# Patient Record
Sex: Male | Born: 1960
Health system: Southern US, Community
[De-identification: ages and names within clinical notes are randomized; demographics above are authoritative.]

---

## 2011-11-03 ENCOUNTER — Encounter: Payer: Self-pay | Admitting: Nurse Practitioner

## 2011-11-03 ENCOUNTER — Encounter: Payer: Self-pay | Admitting: Cardiothoracic Surgery

## 2011-11-06 ENCOUNTER — Encounter: Payer: Self-pay | Admitting: Nurse Practitioner

## 2011-11-06 ENCOUNTER — Encounter: Payer: Self-pay | Admitting: Cardiothoracic Surgery

## 2012-01-06 ENCOUNTER — Ambulatory Visit: Payer: Self-pay | Admitting: Internal Medicine

## 2012-01-10 ENCOUNTER — Inpatient Hospital Stay: Payer: Self-pay | Admitting: Family Medicine

## 2012-01-10 LAB — URINALYSIS, COMPLETE
Bilirubin,UR: NEGATIVE
Glucose,UR: NEGATIVE mg/dL (ref 0–75)
Nitrite: NEGATIVE
Ph: 5 (ref 4.5–8.0)
Protein: 30
Transitional Epi: 1

## 2012-01-10 LAB — COMPREHENSIVE METABOLIC PANEL
Albumin: 1.3 g/dL — ABNORMAL LOW (ref 3.4–5.0)
Anion Gap: 21 — ABNORMAL HIGH (ref 7–16)
BUN: 148 mg/dL — ABNORMAL HIGH (ref 7–18)
Bilirubin,Total: 0.7 mg/dL (ref 0.2–1.0)
Bilirubin,Total: 0.7 mg/dL (ref 0.2–1.0)
Calcium, Total: 7.7 mg/dL — ABNORMAL LOW (ref 8.5–10.1)
Chloride: 103 mmol/L (ref 98–107)
Co2: 9 mmol/L — CL (ref 21–32)
Creatinine: 11.63 mg/dL — ABNORMAL HIGH (ref 0.60–1.30)
Creatinine: 10.67 mg/dL — ABNORMAL HIGH (ref 0.60–1.30)
EGFR (African American): 5 — ABNORMAL LOW
EGFR (African American): 6 — ABNORMAL LOW
EGFR (Non-African Amer.): 4 — ABNORMAL LOW
Glucose: 104 mg/dL — ABNORMAL HIGH (ref 65–99)
Glucose: 124 mg/dL — ABNORMAL HIGH (ref 65–99)
Osmolality: 320 (ref 275–301)
Osmolality: 325 (ref 275–301)
Potassium: 6.4 mmol/L — ABNORMAL HIGH (ref 3.5–5.1)
Potassium: 5.3 mmol/L — ABNORMAL HIGH (ref 3.5–5.1)
SGOT(AST): 12 U/L — ABNORMAL LOW (ref 15–37)
SGPT (ALT): 7 U/L — ABNORMAL LOW (ref 12–78)
SGPT (ALT): 8 U/L — ABNORMAL LOW (ref 12–78)
Total Protein: 7.8 g/dL (ref 6.4–8.2)
Total Protein: 8.2 g/dL (ref 6.4–8.2)

## 2012-01-10 LAB — CK: CK, Total: 101 U/L (ref 35–232)

## 2012-01-10 LAB — CBC
HGB: 7 g/dL — ABNORMAL LOW (ref 13.0–18.0)
MCHC: 30.8 g/dL — ABNORMAL LOW (ref 32.0–36.0)

## 2012-01-10 LAB — CBC WITH DIFFERENTIAL/PLATELET
Basophil %: 0.5 %
Eosinophil #: 0 10*3/uL (ref 0.0–0.7)
Eosinophil %: 0 %
Lymphocyte #: 0.7 10*3/uL — ABNORMAL LOW (ref 1.0–3.6)
MCV: 94 fL (ref 80–100)
Monocyte #: 1.3 x10 3/mm — ABNORMAL HIGH (ref 0.2–1.0)
Monocyte %: 5.4 %
WBC: 24.6 10*3/uL — ABNORMAL HIGH (ref 3.8–10.6)

## 2012-01-10 LAB — SODIUM, URINE, RANDOM: Sodium, Urine Random: 89 mmol/L (ref 20–110)

## 2012-01-10 LAB — CHLORIDE, URINE, RANDOM: Chloride, Urine Random: 80 mmol/L (ref 55–125)

## 2012-01-10 LAB — CREATININE, URINE, RANDOM: Creatinine, Urine Random: 37.5 mg/dL (ref 30.0–125.0)

## 2012-01-11 LAB — CBC WITH DIFFERENTIAL/PLATELET
Basophil #: 0 10*3/uL (ref 0.0–0.1)
Eosinophil #: 0 10*3/uL (ref 0.0–0.7)
HGB: 7.4 g/dL — ABNORMAL LOW (ref 13.0–18.0)
Lymphocyte %: 1.2 %
MCH: 31 pg (ref 26.0–34.0)
MCHC: 33.5 g/dL (ref 32.0–36.0)
MCV: 93 fL (ref 80–100)
Monocyte #: 0.5 x10 3/mm (ref 0.2–1.0)
Monocyte %: 2.5 %
Neutrophil #: 20 10*3/uL — ABNORMAL HIGH (ref 1.4–6.5)
Neutrophil %: 96.3 %
Platelet: 502 10*3/uL — ABNORMAL HIGH (ref 150–440)
RDW: 16.9 % — ABNORMAL HIGH (ref 11.5–14.5)
WBC: 20.8 10*3/uL — ABNORMAL HIGH (ref 3.8–10.6)

## 2012-01-11 LAB — BASIC METABOLIC PANEL
Chloride: 106 mmol/L (ref 98–107)
Co2: 18 mmol/L — ABNORMAL LOW (ref 21–32)
Creatinine: 6.08 mg/dL — ABNORMAL HIGH (ref 0.60–1.30)
EGFR (African American): 11 — ABNORMAL LOW
EGFR (Non-African Amer.): 10 — ABNORMAL LOW

## 2012-01-12 LAB — COMPREHENSIVE METABOLIC PANEL
Albumin: 1.4 g/dL — ABNORMAL LOW (ref 3.4–5.0)
BUN: 73 mg/dL — ABNORMAL HIGH (ref 7–18)
Bilirubin,Total: 0.5 mg/dL (ref 0.2–1.0)
Chloride: 113 mmol/L — ABNORMAL HIGH (ref 98–107)
Glucose: 192 mg/dL — ABNORMAL HIGH (ref 65–99)
Osmolality: 314 (ref 275–301)
SGOT(AST): 10 U/L — ABNORMAL LOW (ref 15–37)
SGPT (ALT): 6 U/L — ABNORMAL LOW (ref 12–78)
Sodium: 144 mmol/L (ref 136–145)
Total Protein: 7.4 g/dL (ref 6.4–8.2)

## 2012-01-12 LAB — URINE CULTURE

## 2012-01-12 LAB — CBC WITH DIFFERENTIAL/PLATELET
Basophil #: 0 10*3/uL (ref 0.0–0.1)
Eosinophil %: 0 %
HCT: 20.3 % — ABNORMAL LOW (ref 40.0–52.0)
HGB: 6.4 g/dL — ABNORMAL LOW (ref 13.0–18.0)
Lymphocyte #: 0.7 10*3/uL — ABNORMAL LOW (ref 1.0–3.6)
MCH: 29.6 pg (ref 26.0–34.0)
MCV: 94 fL (ref 80–100)
Monocyte #: 0.7 x10 3/mm (ref 0.2–1.0)
Monocyte %: 4.2 %
Neutrophil #: 14.8 10*3/uL — ABNORMAL HIGH (ref 1.4–6.5)
Platelet: 404 10*3/uL (ref 150–440)
RBC: 2.16 10*6/uL — ABNORMAL LOW (ref 4.40–5.90)
WBC: 16.1 10*3/uL — ABNORMAL HIGH (ref 3.8–10.6)

## 2012-01-13 DIAGNOSIS — I38 Endocarditis, valve unspecified: Secondary | ICD-10-CM

## 2012-01-13 LAB — CBC WITH DIFFERENTIAL/PLATELET
Basophil #: 0 10*3/uL (ref 0.0–0.1)
Eosinophil #: 0 10*3/uL (ref 0.0–0.7)
Eosinophil %: 0 %
HCT: 19.8 % — ABNORMAL LOW (ref 40.0–52.0)
HGB: 6.5 g/dL — ABNORMAL LOW (ref 13.0–18.0)
Lymphocyte #: 0.4 10*3/uL — ABNORMAL LOW (ref 1.0–3.6)
MCH: 31.1 pg (ref 26.0–34.0)
MCHC: 32.9 g/dL (ref 32.0–36.0)
Monocyte %: 3.8 %
Neutrophil %: 91.9 %
Platelet: 312 10*3/uL (ref 150–440)
RBC: 2.09 10*6/uL — ABNORMAL LOW (ref 4.40–5.90)
WBC: 9.6 10*3/uL (ref 3.8–10.6)

## 2012-01-13 LAB — COMPREHENSIVE METABOLIC PANEL
Alkaline Phosphatase: 95 U/L (ref 50–136)
Anion Gap: 12 (ref 7–16)
Calcium, Total: 7.3 mg/dL — ABNORMAL LOW (ref 8.5–10.1)
Co2: 17 mmol/L — ABNORMAL LOW (ref 21–32)
EGFR (African American): 26 — ABNORMAL LOW
EGFR (Non-African Amer.): 22 — ABNORMAL LOW
Glucose: 153 mg/dL — ABNORMAL HIGH (ref 65–99)
SGOT(AST): 8 U/L — ABNORMAL LOW (ref 15–37)
SGPT (ALT): <6 U/L — ABNORMAL LOW (ref 12–78)
Sodium: 147 mmol/L — ABNORMAL HIGH (ref 136–145)
Total Protein: 6.6 g/dL (ref 6.4–8.2)

## 2012-01-13 LAB — AMMONIA: Ammonia, Plasma: <25 mcmol/L (ref 11–32)

## 2012-01-13 LAB — OCCULT BLOOD X 1 CARD TO LAB, STOOL: Occult Blood, Feces: NEGATIVE

## 2012-01-13 LAB — HEMOGLOBIN: HGB: 7.8 g/dL — ABNORMAL LOW (ref 13.0–18.0)

## 2012-01-14 LAB — BASIC METABOLIC PANEL
Anion Gap: 10 (ref 7–16)
BUN: 55 mg/dL — ABNORMAL HIGH (ref 7–18)
Calcium, Total: 7.1 mg/dL — ABNORMAL LOW (ref 8.5–10.1)
Chloride: 115 mmol/L — ABNORMAL HIGH (ref 98–107)
EGFR (African American): 30 — ABNORMAL LOW
EGFR (Non-African Amer.): 26 — ABNORMAL LOW
Potassium: 3.6 mmol/L (ref 3.5–5.1)
Sodium: 143 mmol/L (ref 136–145)

## 2012-01-14 LAB — CBC WITH DIFFERENTIAL/PLATELET
Basophil %: 0.2 %
Eosinophil #: 0 10*3/uL (ref 0.0–0.7)
Eosinophil %: 0 %
Lymphocyte %: 10.8 %
MCH: 28.2 pg (ref 26.0–34.0)
Monocyte #: 0.4 x10 3/mm (ref 0.2–1.0)
Platelet: 289 10*3/uL (ref 150–440)
RBC: 2.29 10*6/uL — ABNORMAL LOW (ref 4.40–5.90)
RDW: 16.8 % — ABNORMAL HIGH (ref 11.5–14.5)

## 2012-01-15 LAB — PATHOLOGY REPORT

## 2012-01-15 LAB — BASIC METABOLIC PANEL
Anion Gap: 11 (ref 7–16)
BUN: 42 mg/dL — ABNORMAL HIGH (ref 7–18)
Chloride: 113 mmol/L — ABNORMAL HIGH (ref 98–107)
EGFR (Non-African Amer.): 28 — ABNORMAL LOW
Glucose: 173 mg/dL — ABNORMAL HIGH (ref 65–99)
Osmolality: 298 (ref 275–301)
Potassium: 3.8 mmol/L (ref 3.5–5.1)

## 2012-01-15 LAB — HEMOGLOBIN: HGB: 8.7 g/dL — ABNORMAL LOW (ref 13.0–18.0)

## 2012-01-15 LAB — WOUND CULTURE

## 2012-01-16 LAB — CBC WITH DIFFERENTIAL/PLATELET
Basophil #: 0 10*3/uL (ref 0.0–0.1)
Basophil %: 0.1 %
Eosinophil #: 0.1 10*3/uL (ref 0.0–0.7)
Eosinophil %: 0.6 %
Lymphocyte %: 13.9 %
MCHC: 33.1 g/dL (ref 32.0–36.0)
MCV: 91 fL (ref 80–100)
Monocyte %: 9.6 %
Neutrophil #: 7.8 10*3/uL — ABNORMAL HIGH (ref 1.4–6.5)
Platelet: 291 10*3/uL (ref 150–440)
WBC: 10.3 10*3/uL (ref 3.8–10.6)

## 2012-01-16 LAB — BASIC METABOLIC PANEL
Anion Gap: 9 (ref 7–16)
BUN: 37 mg/dL — ABNORMAL HIGH (ref 7–18)
Calcium, Total: 7.1 mg/dL — ABNORMAL LOW (ref 8.5–10.1)
Chloride: 112 mmol/L — ABNORMAL HIGH (ref 98–107)
Co2: 18 mmol/L — ABNORMAL LOW (ref 21–32)
Glucose: 132 mg/dL — ABNORMAL HIGH (ref 65–99)
Potassium: 3.7 mmol/L (ref 3.5–5.1)

## 2012-01-16 LAB — VANCOMYCIN, TROUGH: Vancomycin, Trough: 34 ug/mL (ref 10–20)

## 2012-01-16 LAB — VANCOMYCIN, RANDOM: Vancomycin, Random: 45 ug/mL

## 2012-01-16 LAB — CULTURE, BLOOD (SINGLE)

## 2012-01-16 LAB — WOUND CULTURE

## 2012-01-17 LAB — CREATININE, SERUM
Creatinine: 2.32 mg/dL — ABNORMAL HIGH (ref 0.60–1.30)
EGFR (African American): 37 — ABNORMAL LOW
EGFR (Non-African Amer.): 32 — ABNORMAL LOW

## 2012-01-18 LAB — CBC WITH DIFFERENTIAL/PLATELET
Basophil #: 0 10*3/uL (ref 0.0–0.1)
Basophil %: 0.1 %
Eosinophil #: 0 10*3/uL (ref 0.0–0.7)
HGB: 8.9 g/dL — ABNORMAL LOW (ref 13.0–18.0)
Lymphocyte #: 0.4 10*3/uL — ABNORMAL LOW (ref 1.0–3.6)
Lymphocyte %: 4.7 %
MCV: 93 fL (ref 80–100)
Monocyte #: 0.2 x10 3/mm (ref 0.2–1.0)
Monocyte %: 1.8 %
Neutrophil #: 8.7 10*3/uL — ABNORMAL HIGH (ref 1.4–6.5)
Neutrophil %: 93.4 %
Platelet: 334 10*3/uL (ref 150–440)
RBC: 2.93 10*6/uL — ABNORMAL LOW (ref 4.40–5.90)
RDW: 15.8 % — ABNORMAL HIGH (ref 11.5–14.5)

## 2012-01-18 LAB — BASIC METABOLIC PANEL
Anion Gap: 8 (ref 7–16)
Calcium, Total: 7.2 mg/dL — ABNORMAL LOW (ref 8.5–10.1)
Chloride: 108 mmol/L — ABNORMAL HIGH (ref 98–107)
Glucose: 251 mg/dL — ABNORMAL HIGH (ref 65–99)
Osmolality: 287 (ref 275–301)
Potassium: 4.5 mmol/L (ref 3.5–5.1)
Sodium: 136 mmol/L (ref 136–145)

## 2012-01-18 LAB — CREATININE, SERUM: EGFR (African American): 37 — ABNORMAL LOW

## 2012-01-18 LAB — VANCOMYCIN, TROUGH: Vancomycin, Trough: 38 ug/mL (ref 10–20)

## 2012-01-19 LAB — VANCOMYCIN, RANDOM: Vancomycin, Random: 24 ug/mL

## 2012-01-19 LAB — CREATININE, SERUM: Creatinine: 2.25 mg/dL — ABNORMAL HIGH (ref 0.60–1.30)

## 2012-01-20 LAB — CBC WITH DIFFERENTIAL/PLATELET
Basophil #: 0 10*3/uL (ref 0.0–0.1)
Eosinophil #: 0 10*3/uL (ref 0.0–0.7)
Eosinophil %: 0.2 %
HCT: 24.6 % — ABNORMAL LOW (ref 40.0–52.0)
Lymphocyte #: 1.7 10*3/uL (ref 1.0–3.6)
MCV: 93 fL (ref 80–100)
Monocyte #: 0.7 x10 3/mm (ref 0.2–1.0)
Neutrophil #: 5.1 10*3/uL (ref 1.4–6.5)
Neutrophil %: 67.4 %
Platelet: 392 10*3/uL (ref 150–440)
RDW: 15.6 % — ABNORMAL HIGH (ref 11.5–14.5)
WBC: 7.6 10*3/uL (ref 3.8–10.6)

## 2012-01-20 LAB — RENAL FUNCTION PANEL
Albumin: 1.4 g/dL — ABNORMAL LOW (ref 3.4–5.0)
BUN: 36 mg/dL — ABNORMAL HIGH (ref 7–18)
Calcium, Total: 6 mg/dL — CL (ref 8.5–10.1)
Co2: 18 mmol/L — ABNORMAL LOW (ref 21–32)
Creatinine: 1.69 mg/dL — ABNORMAL HIGH (ref 0.60–1.30)
EGFR (Non-African Amer.): 46 — ABNORMAL LOW
Glucose: 109 mg/dL — ABNORMAL HIGH (ref 65–99)
Osmolality: 296 (ref 275–301)
Potassium: 3.4 mmol/L — ABNORMAL LOW (ref 3.5–5.1)

## 2012-01-20 LAB — VANCOMYCIN, TROUGH: Vancomycin, Trough: 13 ug/mL (ref 10–20)

## 2012-01-27 ENCOUNTER — Inpatient Hospital Stay: Payer: Self-pay | Admitting: Family Medicine

## 2012-01-27 LAB — CBC WITH DIFFERENTIAL/PLATELET
Basophil #: 0 10*3/uL (ref 0.0–0.1)
Basophil %: 0.3 %
Eosinophil #: 0 10*3/uL (ref 0.0–0.7)
HCT: 21.5 % — ABNORMAL LOW (ref 40.0–52.0)
MCHC: 33.9 g/dL (ref 32.0–36.0)
MCV: 92 fL (ref 80–100)
Monocyte #: 0.3 x10 3/mm (ref 0.2–1.0)
Neutrophil %: 88 %
RBC: 2.34 10*6/uL — ABNORMAL LOW (ref 4.40–5.90)
WBC: 8.4 10*3/uL (ref 3.8–10.6)

## 2012-01-27 LAB — URINALYSIS, COMPLETE
Glucose,UR: NEGATIVE mg/dL (ref 0–75)
Ketone: NEGATIVE
Nitrite: NEGATIVE
Ph: 5 (ref 4.5–8.0)
Protein: 100
Specific Gravity: 1.014 (ref 1.003–1.030)
WBC UR: 561 /HPF (ref 0–5)

## 2012-01-27 LAB — COMPREHENSIVE METABOLIC PANEL
Alkaline Phosphatase: 236 U/L — ABNORMAL HIGH (ref 50–136)
Anion Gap: 12 (ref 7–16)
BUN: 77 mg/dL — ABNORMAL HIGH (ref 7–18)
Bilirubin,Total: 0.5 mg/dL (ref 0.2–1.0)
Calcium, Total: 7.5 mg/dL — ABNORMAL LOW (ref 8.5–10.1)
Co2: 18 mmol/L — ABNORMAL LOW (ref 21–32)
EGFR (Non-African Amer.): 10 — ABNORMAL LOW
Glucose: 100 mg/dL — ABNORMAL HIGH (ref 65–99)
Osmolality: 289 (ref 275–301)
Potassium: 4.1 mmol/L (ref 3.5–5.1)
SGPT (ALT): 13 U/L (ref 12–78)
Sodium: 133 mmol/L — ABNORMAL LOW (ref 136–145)
Total Protein: 7.6 g/dL (ref 6.4–8.2)

## 2012-01-27 LAB — RAPID INFLUENZA A&B ANTIGENS

## 2012-01-27 LAB — VANCOMYCIN, RANDOM: Vancomycin, Random: 16 ug/mL

## 2012-01-28 LAB — CBC WITH DIFFERENTIAL/PLATELET
Basophil #: 0 10*3/uL (ref 0.0–0.1)
Basophil %: 0.2 %
Basophil %: 0.3 %
Eosinophil #: 0 10*3/uL (ref 0.0–0.7)
Eosinophil %: 0 %
Eosinophil %: 0 %
HCT: 22.1 % — ABNORMAL LOW (ref 40.0–52.0)
Lymphocyte #: 0.3 10*3/uL — ABNORMAL LOW (ref 1.0–3.6)
Lymphocyte #: 0.8 10*3/uL — ABNORMAL LOW (ref 1.0–3.6)
Lymphocyte %: 11.3 %
MCH: 29.7 pg (ref 26.0–34.0)
MCHC: 32.6 g/dL (ref 32.0–36.0)
MCHC: 32.2 g/dL (ref 32.0–36.0)
MCV: 93 fL (ref 80–100)
MCV: 92 fL (ref 80–100)
Monocyte #: 0.3 x10 3/mm (ref 0.2–1.0)
Neutrophil #: 6.6 10*3/uL — ABNORMAL HIGH (ref 1.4–6.5)
Neutrophil #: 5.8 10*3/uL (ref 1.4–6.5)
Neutrophil %: 92.5 %
RBC: 2.93 10*6/uL — ABNORMAL LOW (ref 4.40–5.90)
WBC: 6.9 10*3/uL (ref 3.8–10.6)

## 2012-01-28 LAB — BASIC METABOLIC PANEL
Anion Gap: 13 (ref 7–16)
BUN: 79 mg/dL — ABNORMAL HIGH (ref 7–18)
Calcium, Total: 7.2 mg/dL — ABNORMAL LOW (ref 8.5–10.1)
Chloride: 108 mmol/L — ABNORMAL HIGH (ref 98–107)
Creatinine: 5.84 mg/dL — ABNORMAL HIGH (ref 0.60–1.30)
EGFR (African American): 12 — ABNORMAL LOW
EGFR (Non-African Amer.): 10 — ABNORMAL LOW
Glucose: 196 mg/dL — ABNORMAL HIGH (ref 65–99)
Osmolality: 303 (ref 275–301)

## 2012-01-28 LAB — TROPONIN I: Troponin-I: <0.02 ng/mL

## 2012-01-29 LAB — BASIC METABOLIC PANEL
Anion Gap: 11 (ref 7–16)
Chloride: 111 mmol/L — ABNORMAL HIGH (ref 98–107)
Co2: 17 mmol/L — ABNORMAL LOW (ref 21–32)
Creatinine: 4.66 mg/dL — ABNORMAL HIGH (ref 0.60–1.30)
EGFR (African American): 16 — ABNORMAL LOW
EGFR (Non-African Amer.): 14 — ABNORMAL LOW
Potassium: 3.9 mmol/L (ref 3.5–5.1)
Sodium: 139 mmol/L (ref 136–145)

## 2012-01-29 LAB — CBC WITH DIFFERENTIAL/PLATELET
Basophil #: 0 10*3/uL (ref 0.0–0.1)
Eosinophil #: 0 10*3/uL (ref 0.0–0.7)
Eosinophil %: 0 %
HGB: 8.1 g/dL — ABNORMAL LOW (ref 13.0–18.0)
Lymphocyte #: 0.3 10*3/uL — ABNORMAL LOW (ref 1.0–3.6)
Lymphocyte %: 7.9 %
MCH: 31.4 pg (ref 26.0–34.0)
MCV: 93 fL (ref 80–100)
Monocyte #: 0.1 x10 3/mm — ABNORMAL LOW (ref 0.2–1.0)
Monocyte %: 3.1 %
Neutrophil #: 3.9 10*3/uL (ref 1.4–6.5)
Platelet: 276 10*3/uL (ref 150–440)
RBC: 2.58 10*6/uL — ABNORMAL LOW (ref 4.40–5.90)
RDW: 15.8 % — ABNORMAL HIGH (ref 11.5–14.5)

## 2012-01-29 LAB — URINE CULTURE

## 2012-01-29 LAB — VANCOMYCIN, TROUGH: Vancomycin, Trough: 13 ug/mL (ref 10–20)

## 2012-01-30 LAB — CULTURE, BLOOD (SINGLE)

## 2012-01-31 LAB — BASIC METABOLIC PANEL
Anion Gap: 9 (ref 7–16)
BUN: 72 mg/dL — ABNORMAL HIGH (ref 7–18)
Chloride: 115 mmol/L — ABNORMAL HIGH (ref 98–107)
Co2: 19 mmol/L — ABNORMAL LOW (ref 21–32)
Creatinine: 2.99 mg/dL — ABNORMAL HIGH (ref 0.60–1.30)
EGFR (African American): 27 — ABNORMAL LOW
EGFR (Non-African Amer.): 23 — ABNORMAL LOW
Glucose: 164 mg/dL — ABNORMAL HIGH (ref 65–99)
Potassium: 3.2 mmol/L — ABNORMAL LOW (ref 3.5–5.1)

## 2012-01-31 LAB — CBC WITH DIFFERENTIAL/PLATELET
Eosinophil #: 0 10*3/uL (ref 0.0–0.7)
HCT: 23.1 % — ABNORMAL LOW (ref 40.0–52.0)
Lymphocyte %: 14.4 %
MCH: 30 pg (ref 26.0–34.0)
Monocyte #: 0.3 x10 3/mm (ref 0.2–1.0)
Neutrophil %: 79.5 %
Platelet: 287 10*3/uL (ref 150–440)
RBC: 2.55 10*6/uL — ABNORMAL LOW (ref 4.40–5.90)
RDW: 15.2 % — ABNORMAL HIGH (ref 11.5–14.5)
WBC: 5.5 10*3/uL (ref 3.8–10.6)

## 2012-01-31 LAB — OCCULT BLOOD X 1 CARD TO LAB, STOOL: Occult Blood, Feces: POSITIVE

## 2012-02-01 LAB — CULTURE, BLOOD (SINGLE)

## 2012-02-01 LAB — BASIC METABOLIC PANEL
Anion Gap: 10 (ref 7–16)
Calcium, Total: 7.1 mg/dL — ABNORMAL LOW (ref 8.5–10.1)
Chloride: 116 mmol/L — ABNORMAL HIGH (ref 98–107)
EGFR (African American): 35 — ABNORMAL LOW
EGFR (Non-African Amer.): 30 — ABNORMAL LOW
Glucose: 182 mg/dL — ABNORMAL HIGH (ref 65–99)
Osmolality: 312 (ref 275–301)
Potassium: 3 mmol/L — ABNORMAL LOW (ref 3.5–5.1)
Sodium: 146 mmol/L — ABNORMAL HIGH (ref 136–145)

## 2012-02-06 ENCOUNTER — Ambulatory Visit: Payer: Self-pay | Admitting: Internal Medicine

## 2012-02-13 LAB — CULTURE, BLOOD (SINGLE)

## 2012-04-05 ENCOUNTER — Ambulatory Visit: Payer: Self-pay | Admitting: Nurse Practitioner

## 2012-05-05 ENCOUNTER — Ambulatory Visit: Payer: Self-pay | Admitting: Nurse Practitioner

## 2012-05-20 ENCOUNTER — Inpatient Hospital Stay: Payer: Self-pay | Admitting: Internal Medicine

## 2012-05-20 LAB — COMPREHENSIVE METABOLIC PANEL
Alkaline Phosphatase: 131 U/L (ref 50–136)
Anion Gap: 10 (ref 7–16)
Calcium, Total: 9 mg/dL (ref 8.5–10.1)
Co2: 22 mmol/L (ref 21–32)
Creatinine: 6.72 mg/dL — ABNORMAL HIGH (ref 0.60–1.30)
EGFR (Non-African Amer.): 9 — ABNORMAL LOW
Glucose: 77 mg/dL (ref 65–99)
Osmolality: 295 (ref 275–301)
Potassium: 6.3 mmol/L — ABNORMAL HIGH (ref 3.5–5.1)
Sodium: 136 mmol/L (ref 136–145)

## 2012-05-20 LAB — CBC
HCT: 24.8 % — ABNORMAL LOW (ref 40.0–52.0)
HGB: 8 g/dL — ABNORMAL LOW (ref 13.0–18.0)
MCH: 29.2 pg (ref 26.0–34.0)
MCHC: 32.4 g/dL (ref 32.0–36.0)
MCV: 90 fL (ref 80–100)
Platelet: 458 10*3/uL — ABNORMAL HIGH (ref 150–440)
RBC: 2.75 10*6/uL — ABNORMAL LOW (ref 4.40–5.90)
RDW: 16.2 % — ABNORMAL HIGH (ref 11.5–14.5)
WBC: 12.6 10*3/uL — ABNORMAL HIGH (ref 3.8–10.6)

## 2012-05-20 LAB — URINALYSIS, COMPLETE
Glucose,UR: NEGATIVE mg/dL (ref 0–75)
Ketone: NEGATIVE
Nitrite: NEGATIVE
RBC,UR: 545 /HPF (ref 0–5)
Specific Gravity: 1.012 (ref 1.003–1.030)
Squamous Epithelial: 5
WBC UR: 541 /HPF (ref 0–5)

## 2012-05-20 LAB — POTASSIUM: Potassium: 4.9 mmol/L (ref 3.5–5.1)

## 2012-05-20 LAB — MAGNESIUM: Magnesium: 3.2 mg/dL — ABNORMAL HIGH

## 2012-05-21 LAB — CBC WITH DIFFERENTIAL/PLATELET
Basophil %: 0.5 %
Eosinophil #: 0.4 10*3/uL (ref 0.0–0.7)
Eosinophil %: 3.2 %
HCT: 23.8 % — ABNORMAL LOW (ref 40.0–52.0)
Lymphocyte #: 1.5 10*3/uL (ref 1.0–3.6)
MCHC: 32.6 g/dL (ref 32.0–36.0)
MCV: 90 fL (ref 80–100)
Monocyte %: 5.9 %
Neutrophil #: 10.1 10*3/uL — ABNORMAL HIGH (ref 1.4–6.5)
Neutrophil %: 79 %
Platelet: 434 10*3/uL (ref 150–440)
RBC: 2.65 10*6/uL — ABNORMAL LOW (ref 4.40–5.90)
RDW: 16.2 % — ABNORMAL HIGH (ref 11.5–14.5)
WBC: 12.8 10*3/uL — ABNORMAL HIGH (ref 3.8–10.6)

## 2012-05-21 LAB — BASIC METABOLIC PANEL
Anion Gap: 11 (ref 7–16)
BUN: 78 mg/dL — ABNORMAL HIGH (ref 7–18)
Chloride: 104 mmol/L (ref 98–107)
Co2: 21 mmol/L (ref 21–32)
Creatinine: 6.59 mg/dL — ABNORMAL HIGH (ref 0.60–1.30)
EGFR (African American): 10 — ABNORMAL LOW
Glucose: 82 mg/dL (ref 65–99)
Osmolality: 294 (ref 275–301)
Sodium: 136 mmol/L (ref 136–145)

## 2012-05-22 LAB — URINE CULTURE

## 2012-05-22 LAB — IRON AND TIBC
Iron Bind.Cap.(Total): 109 ug/dL — ABNORMAL LOW (ref 250–450)
Iron Saturation: 25 %
Iron: 27 ug/dL — ABNORMAL LOW (ref 65–175)
Unbound Iron-Bind.Cap.: 82 ug/dL

## 2012-05-22 LAB — BASIC METABOLIC PANEL
BUN: 68 mg/dL — ABNORMAL HIGH (ref 7–18)
Calcium, Total: 8.2 mg/dL — ABNORMAL LOW (ref 8.5–10.1)
Chloride: 105 mmol/L (ref 98–107)
Creatinine: 6.32 mg/dL — ABNORMAL HIGH (ref 0.60–1.30)
Potassium: 4.5 mmol/L (ref 3.5–5.1)
Sodium: 137 mmol/L (ref 136–145)

## 2012-05-22 LAB — HEMOGLOBIN: HGB: 7.3 g/dL — ABNORMAL LOW (ref 13.0–18.0)

## 2012-05-22 LAB — FERRITIN: Ferritin (ARMC): 623 ng/mL — ABNORMAL HIGH (ref 8–388)

## 2012-05-23 LAB — BASIC METABOLIC PANEL
Anion Gap: 12 (ref 7–16)
BUN: 63 mg/dL — ABNORMAL HIGH (ref 7–18)
Calcium, Total: 8.6 mg/dL (ref 8.5–10.1)
Chloride: 110 mmol/L — ABNORMAL HIGH (ref 98–107)
Creatinine: 6.17 mg/dL — ABNORMAL HIGH (ref 0.60–1.30)
EGFR (African American): 11 — ABNORMAL LOW
EGFR (Non-African Amer.): 10 — ABNORMAL LOW
Osmolality: 296 (ref 275–301)
Sodium: 140 mmol/L (ref 136–145)

## 2012-05-24 LAB — BASIC METABOLIC PANEL
Anion Gap: 12 (ref 7–16)
BUN: 52 mg/dL — ABNORMAL HIGH (ref 7–18)
Calcium, Total: 8.4 mg/dL — ABNORMAL LOW (ref 8.5–10.1)
Co2: 18 mmol/L — ABNORMAL LOW (ref 21–32)
Creatinine: 5.89 mg/dL — ABNORMAL HIGH (ref 0.60–1.30)
EGFR (Non-African Amer.): 10 — ABNORMAL LOW
Glucose: 82 mg/dL (ref 65–99)
Osmolality: 294 (ref 275–301)
Sodium: 141 mmol/L (ref 136–145)

## 2012-05-25 LAB — BASIC METABOLIC PANEL
Anion Gap: 11 (ref 7–16)
Creatinine: 5.77 mg/dL — ABNORMAL HIGH (ref 0.60–1.30)
EGFR (African American): 12 — ABNORMAL LOW
Glucose: 131 mg/dL — ABNORMAL HIGH (ref 65–99)
Osmolality: 298 (ref 275–301)
Sodium: 143 mmol/L (ref 136–145)

## 2012-05-26 LAB — BASIC METABOLIC PANEL
Anion Gap: 11 (ref 7–16)
BUN: 38 mg/dL — ABNORMAL HIGH (ref 7–18)
Co2: 18 mmol/L — ABNORMAL LOW (ref 21–32)
Creatinine: 5.8 mg/dL — ABNORMAL HIGH (ref 0.60–1.30)
EGFR (African American): 12 — ABNORMAL LOW
EGFR (Non-African Amer.): 10 — ABNORMAL LOW
Glucose: 92 mg/dL (ref 65–99)
Sodium: 141 mmol/L (ref 136–145)

## 2012-05-27 LAB — BASIC METABOLIC PANEL
Chloride: 116 mmol/L — ABNORMAL HIGH (ref 98–107)
Co2: 19 mmol/L — ABNORMAL LOW (ref 21–32)
Creatinine: 5.47 mg/dL — ABNORMAL HIGH (ref 0.60–1.30)
EGFR (African American): 13 — ABNORMAL LOW
EGFR (Non-African Amer.): 11 — ABNORMAL LOW
Glucose: 86 mg/dL (ref 65–99)
Osmolality: 294 (ref 275–301)
Potassium: 3.5 mmol/L (ref 3.5–5.1)

## 2012-05-27 LAB — URINALYSIS, COMPLETE
Glucose,UR: 150 mg/dL (ref 0–75)
Ketone: NEGATIVE
Nitrite: NEGATIVE
Squamous Epithelial: NONE SEEN

## 2012-05-27 LAB — PROTEIN / CREATININE RATIO, URINE
Creatinine, Urine: 25.4 mg/dL — ABNORMAL LOW (ref 30.0–125.0)
Protein/Creat. Ratio: 9646 mg/gCREAT — ABNORMAL HIGH (ref 0–200)

## 2012-05-27 LAB — HEMOGLOBIN: HGB: 7 g/dL — ABNORMAL LOW (ref 13.0–18.0)

## 2012-05-28 LAB — CREATININE CLEARANCE, URINE, 24 HOUR
Collection Hours: 24 hours
Creatinine, Urine: 21.6 mg/dL — ABNORMAL LOW (ref 30.0–125.0)

## 2012-05-29 LAB — RENAL FUNCTION PANEL
Anion Gap: 9 (ref 7–16)
Calcium, Total: 8 mg/dL — ABNORMAL LOW (ref 8.5–10.1)
Creatinine: 4.84 mg/dL — ABNORMAL HIGH (ref 0.60–1.30)
Osmolality: 295 (ref 275–301)
Potassium: 3.5 mmol/L (ref 3.5–5.1)
Sodium: 145 mmol/L (ref 136–145)

## 2012-05-29 LAB — HEMOGLOBIN: HGB: 6.9 g/dL — ABNORMAL LOW (ref 13.0–18.0)

## 2012-05-30 LAB — BASIC METABOLIC PANEL
Anion Gap: 10 (ref 7–16)
BUN: 26 mg/dL — ABNORMAL HIGH (ref 7–18)
Calcium, Total: 8.5 mg/dL (ref 8.5–10.1)
Chloride: 115 mmol/L — ABNORMAL HIGH (ref 98–107)
Co2: 17 mmol/L — ABNORMAL LOW (ref 21–32)
Creatinine: 4.48 mg/dL — ABNORMAL HIGH (ref 0.60–1.30)
Osmolality: 287 (ref 275–301)
Potassium: 3.9 mmol/L (ref 3.5–5.1)
Sodium: 142 mmol/L (ref 136–145)

## 2012-05-31 LAB — CBC WITH DIFFERENTIAL/PLATELET
Basophil %: 0.6 %
HCT: 27.2 % — ABNORMAL LOW (ref 40.0–52.0)
HGB: 9.1 g/dL — ABNORMAL LOW (ref 13.0–18.0)
Lymphocyte #: 1.1 10*3/uL (ref 1.0–3.6)
Lymphocyte %: 11.3 %
MCH: 29.6 pg (ref 26.0–34.0)
Monocyte #: 0.7 x10 3/mm (ref 0.2–1.0)
Neutrophil %: 77.3 %
Platelet: 413 10*3/uL (ref 150–440)
WBC: 9.6 10*3/uL (ref 3.8–10.6)

## 2012-05-31 LAB — BASIC METABOLIC PANEL
Anion Gap: 10 (ref 7–16)
Chloride: 115 mmol/L — ABNORMAL HIGH (ref 98–107)
Co2: 18 mmol/L — ABNORMAL LOW (ref 21–32)
Creatinine: 4.44 mg/dL — ABNORMAL HIGH (ref 0.60–1.30)
EGFR (African American): 17 — ABNORMAL LOW
Glucose: 77 mg/dL (ref 65–99)
Potassium: 4.5 mmol/L (ref 3.5–5.1)
Sodium: 143 mmol/L (ref 136–145)

## 2012-06-01 LAB — BASIC METABOLIC PANEL
Anion Gap: 11 (ref 7–16)
BUN: 27 mg/dL — ABNORMAL HIGH (ref 7–18)
Calcium, Total: 8.5 mg/dL (ref 8.5–10.1)
Chloride: 114 mmol/L — ABNORMAL HIGH (ref 98–107)
Creatinine: 4.44 mg/dL — ABNORMAL HIGH (ref 0.60–1.30)
EGFR (African American): 17 — ABNORMAL LOW
Glucose: 76 mg/dL (ref 65–99)
Osmolality: 285 (ref 275–301)
Potassium: 4.3 mmol/L (ref 3.5–5.1)
Sodium: 141 mmol/L (ref 136–145)

## 2012-06-02 LAB — PROTEIN / CREATININE RATIO, URINE
Creatinine, Urine: 21.8 mg/dL — ABNORMAL LOW (ref 30.0–125.0)
Protein, Random Urine: 258 mg/dL — ABNORMAL HIGH (ref 0–12)
Protein/Creat. Ratio: 11835 mg/gCREAT — ABNORMAL HIGH (ref 0–200)

## 2012-06-02 LAB — BASIC METABOLIC PANEL
BUN: 25 mg/dL — ABNORMAL HIGH (ref 7–18)
BUN: 24 mg/dL — ABNORMAL HIGH (ref 7–18)
Calcium, Total: 8.5 mg/dL (ref 8.5–10.1)
Chloride: 117 mmol/L — ABNORMAL HIGH (ref 98–107)
Co2: 18 mmol/L — ABNORMAL LOW (ref 21–32)
Co2: 19 mmol/L — ABNORMAL LOW (ref 21–32)
Creatinine: 4.28 mg/dL — ABNORMAL HIGH (ref 0.60–1.30)
Creatinine: 4.08 mg/dL — ABNORMAL HIGH (ref 0.60–1.30)
EGFR (African American): 17 — ABNORMAL LOW
EGFR (African American): 18 — ABNORMAL LOW
EGFR (Non-African Amer.): 15 — ABNORMAL LOW
EGFR (Non-African Amer.): 16 — ABNORMAL LOW
Glucose: 72 mg/dL (ref 65–99)
Osmolality: 290 (ref 275–301)
Potassium: 4.6 mmol/L (ref 3.5–5.1)
Sodium: 144 mmol/L (ref 136–145)
Sodium: 144 mmol/L (ref 136–145)

## 2012-06-02 LAB — URINALYSIS, COMPLETE
Glucose,UR: 150 mg/dL (ref 0–75)
Ketone: NEGATIVE
Nitrite: NEGATIVE
Ph: 6 (ref 4.5–8.0)
Protein: 100
RBC,UR: 347 /HPF (ref 0–5)
Specific Gravity: 1.009 (ref 1.003–1.030)
Squamous Epithelial: 1

## 2012-06-03 LAB — HEMOGLOBIN: HGB: 8.6 g/dL — ABNORMAL LOW (ref 13.0–18.0)

## 2012-06-03 LAB — BASIC METABOLIC PANEL
BUN: 24 mg/dL — ABNORMAL HIGH (ref 7–18)
Calcium, Total: 8.4 mg/dL — ABNORMAL LOW (ref 8.5–10.1)
Chloride: 115 mmol/L — ABNORMAL HIGH (ref 98–107)
Creatinine: 4.2 mg/dL — ABNORMAL HIGH (ref 0.60–1.30)
EGFR (Non-African Amer.): 15 — ABNORMAL LOW
Osmolality: 284 (ref 275–301)
Potassium: 4 mmol/L (ref 3.5–5.1)
Sodium: 141 mmol/L (ref 136–145)

## 2012-06-05 ENCOUNTER — Ambulatory Visit: Payer: Self-pay | Admitting: Nurse Practitioner

## 2012-06-22 ENCOUNTER — Inpatient Hospital Stay: Payer: Self-pay | Admitting: Internal Medicine

## 2012-06-22 LAB — COMPREHENSIVE METABOLIC PANEL
Albumin: 1.7 g/dL — ABNORMAL LOW (ref 3.4–5.0)
Anion Gap: 15 (ref 7–16)
BUN: 87 mg/dL — ABNORMAL HIGH (ref 7–18)
Bilirubin,Total: 0.5 mg/dL (ref 0.2–1.0)
Calcium, Total: 9.2 mg/dL (ref 8.5–10.1)
Co2: 13 mmol/L — ABNORMAL LOW (ref 21–32)
Creatinine: 9.43 mg/dL — ABNORMAL HIGH (ref 0.60–1.30)
EGFR (African American): 7 — ABNORMAL LOW
EGFR (Non-African Amer.): 6 — ABNORMAL LOW
Glucose: 76 mg/dL (ref 65–99)
Potassium: 6 mmol/L — ABNORMAL HIGH (ref 3.5–5.1)
SGOT(AST): 8 U/L — ABNORMAL LOW (ref 15–37)
SGPT (ALT): 6 U/L — ABNORMAL LOW (ref 12–78)
Total Protein: 9 g/dL — ABNORMAL HIGH (ref 6.4–8.2)

## 2012-06-22 LAB — URINALYSIS, COMPLETE
Glucose,UR: NEGATIVE mg/dL (ref 0–75)
Nitrite: NEGATIVE
Ph: 5 (ref 4.5–8.0)
RBC,UR: 17 /HPF (ref 0–5)
Specific Gravity: 1.014 (ref 1.003–1.030)
Squamous Epithelial: 3
WBC UR: 1496 /HPF (ref 0–5)

## 2012-06-22 LAB — CBC
HCT: 23.9 % — ABNORMAL LOW (ref 40.0–52.0)
MCH: 27.8 pg (ref 26.0–34.0)
MCHC: 31.4 g/dL — ABNORMAL LOW (ref 32.0–36.0)
MCV: 89 fL (ref 80–100)
Platelet: 519 10*3/uL — ABNORMAL HIGH (ref 150–440)
RBC: 2.7 10*6/uL — ABNORMAL LOW (ref 4.40–5.90)
WBC: 19.4 10*3/uL — ABNORMAL HIGH (ref 3.8–10.6)

## 2012-07-05 ENCOUNTER — Ambulatory Visit: Payer: Self-pay | Admitting: Nurse Practitioner

## 2012-08-05 DEATH — deceased

## 2014-04-27 NOTE — Consult Note (Signed)
PATIENT NAME:  Martin Jenkins, Martin Jenkins MR#:  [id number] DATE OF BIRTH:  1960/08/27  DATE OF CONSULTATION:  01/18/2012  REFERRING PHYSICIAN:  Ramonita Lab, MD  CONSULTING PHYSICIAN:  Dave Mannes B. Lizbeth Feijoo, MD  REASON FOR CONSULTATION: To make recommendation for discharge medications.   IDENTIFYING DATA: The patient is a 54 year old man with a history of bipolar disorder.   CHIEF COMPLAINT:  "I can't sleep."   HISTORY OF PRESENT ILLNESS: The patient was brought to the hospital with altered mental status. He was treated for septic shock due to hip osteomyelitis. His symptoms resolved and altered mental status cleared. He was originally consulted by Dr. Garnetta Buddy on January 7th.  At that time the patient was still ill and n.p.o., and no medications were offered except for IV Ativan. At present, the patient is waiting for a PASRR number to be transferred to a skilled nursing facility. The patient reports that he is uncertain who his current psychiatrist is. In the past, he was in care of multiple different psychiatrists and tried different medications. He now resides at [facility] and is managed by the facility physician.  He does not know his medications. He complains of extremely poor sleep and mood swings. He reports that he most of the time is hyper, talkative, loud, with lots of energy and inability to sleep. He reports racing thoughts. He also is very emotional and cries frequently and is asking for medication to address insomnia as well as mood lability. He denies symptoms of anxiety. There are no psychotic symptoms or symptoms suggestive of bipolar mania. He denies alcohol, illicit drug or prescription pill abuse.   PAST PSYCHIATRIC HISTORY: The patient reports multiple psychiatric hospitalizations when younger, but he has not been hospitalized since his accident resulting in paraplegia in 44. 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   FAMILY PSYCHIATRIC HISTORY: None reported.   PAST MEDICAL HISTORY: Recent sepsis due to osteomyelitis, diabetes mellitus, urinary tract infection, pressure ulcers, colostomy, paraplegia, MRSA-positive.   ALLERGIES: BENADRYL, MACROBID, PENICILLINS, BACTRIM.  MEDICATIONS AT THE TIME OF CONSULTATION: Sliding scale insulin, Zofran injection, Levemir 15 mg every 24 hours, Xanax 0.5 mg twice daily as needed for anxiety, BuSpar 10 mg every 8 hours, lorazepam IV p.r.n., Remeron 30 mg at bedtime, morphine p.r.n., heparin, Zyprexa Zydis 5 mg 3 times daily, Protonix 40 mg in the morning, prednisone taper.   SOCIAL HISTORY: The patient is a resident of a rest home. He uses a wheelchair for transportation. He has an expensive model there. He will be going to a skilled nursing facility for rehab now. He is waiting for a PASRR.   REVIEW OF SYSTEMS: CONSTITUTIONAL: No fevers or chills. Positive for gradual weight gain.  EYES: No double or blurred vision.  ENT: No hearing loss.  RESPIRATORY: No shortness of breath or cough.  CARDIOVASCULAR: No chest pain or orthopnea.  GASTROINTESTINAL: Colostomy bag.  GENITOURINARY: No incontinence or frequency. Positive for frequent urinary tract infections.  ENDOCRINE: No heat or cold intolerance.  LYMPHATIC: No anemia or easy bruising.  INTEGUMENTARY: No acne or rash.  MUSCULOSKELETAL: No muscle or joint pain.  NEUROLOGIC: He is paraplegic.  PSYCHIATRIC: See history of present illness for details.   PHYSICAL EXAMINATION: VITAL SIGNS: Blood pressure 110/69, pulse 91, respirations 19, temperature 98.2.  GENERAL: This is an obese paraplegic patient in no acute distress. The rest of the physical examination is deferred  to his primary attending.   LABORATORY DATA: Chemistries: Blood glucose 251, BUN 32, creatinine 2.31, sodium 136, potassium 4.5. CBC: White blood count 9.3, RBC 293, hemoglobin 8.9, hematocrit 27.3.   MENTAL STATUS EXAMINATION:  The patient is alert and oriented to person, place, time, and situation. He is very pleasant, polite, and cooperative, rather talkative and appropriately funny. He is in bed wearing a hospital gown. He is paraplegic but has been waving his arms and gesticulating very adamantly. He maintains good eye contact. His speech is of normal rhythm, rate, and volume. Mood is fine with labile affect. Thought processing is logical and goal oriented. Thought content: He denies suicidal or homicidal ideation. There are no delusions or paranoia. There are no auditory or visual hallucinations. His cognition is grossly intact. He registers 3 out of 3 and recalls 3 out of 3 objects after 5 minutes. He can spell world forwards and backwards. He knows the current Economistresident. His insight and judgment are fair.   SUICIDE RISK ASSESSMENT: This is a patient with a history of depression, mood instability, severe medical problems including paraplegia since 1990. He  is in good spirits and adamantly denies ever having thoughts of hurting himself in spite of difficult situation.   DIAGNOSES: AXIS I:  Mood disorder, not otherwise specified.   AXIS II: Deferred.   AXIS III: Multiple medical problems as above.   AXIS IV: Mental and physical illness, primary support, loss of way of life.   AXIS V: Global assessment of functioning score is 45.   PLAN:  1. Mood instability: The patient originally was admitted Seroquel 200 mg 3 times a day prescribed at the facility. Dr. Garnetta BuddyFaheem did not make any medication suggestions as the patient was n.p.o. at the time of her consultation. Seroquel was changed to Zyprexa Zydis 5 mg 3 times a day during current hospitalization. It is unclear whether the patient needs such a hefty dose of antipsychotic, especially Zyprexa, that will contribute further to his obesity and limit his physical fitness.  I would suggest to decrease the dose of Zyprexa and give it as a single dose at night. 2. Mood  stabilization:  The patient is really battered by his mood instability and frequent crying spells. We will offer Tegretol for mood stabilization.  3. Insomnia: We will start temazepam tonight.  4. We will continue BuSpar and low-dose Xanax for now.  5. I will follow up.  ____________________________ Braulio ConteJolanta B. Jennet MaduroPucilowska, MD jbp:cb D: 01/19/2012 15:10:32 ET T: 01/19/2012 17:49:03 ET JOB#: [id number] cc: Normon Pettijohn B. Jennet MaduroPucilowska, MD, <Dictator> Shari ProwsJOLANTA B Dominyk Law MD ELECTRONICALLY SIGNED 01/21/2012 7:52

## 2014-04-27 NOTE — Consult Note (Signed)
PATIENT NAME:  Martin Jenkins, Martin Jenkins MR#:  [id number] DATE OF BIRTH:  1960-08-19  DATE OF CONSULTATION:  01/10/2012  CONSULTING PHYSICIAN:  Martin NeedyJason S. Natoya Viscomi, MD  REASON FOR CONSULTATION: Need for dialysis catheter and arterial line.   HISTORY OF PRESENT ILLNESS: This is a 54 year old male with a history of multiple issues and chronic paraplegia after an injury who was admitted with acute renal failure and a potassium of over 6.5 and a creatinine of over 11.  He needs urgent dialysis.  He also was hypotensive on multiple pressors, and we are asked to place a dialysis catheter and arterial line. He cannot provide any history due to the critical illness. This is obtained from the previous medical record.   PAST MEDICAL HISTORY: Significant for: 1.  Paraplegia.  2.  Sacral decubitus with osteomyelitis.  3.  Diabetes mellitus.  4.  Recent admission for sepsis.  5.  Chronic indwelling catheter and colostomy.   PAST SURGICAL HISTORY:  1.  Right a.k.a.  2.  Colostomy.  3.  Repair of ruptured bladder with chronic catheterization.   ALLERGIES: 1.  BENADRYL. 2.  MACROBID. 3.  PENICILLIN. 4.  SULFA. 5.  TRIMETHOPRIM.  HOME MEDICATIONS:  The history and physical is unknown and he cannot provide history.  SOCIAL HISTORY: Resident of [facility].  Unclear of any alcohol or tobacco abuse currently.   FAMILY HISTORY: Unobtainable.   REVIEW OF SYSTEMS:  Unobtainable.   PHYSICAL EXAMINATION: GENERAL: This is a critically ill, white male who initially is lying quietly in the Critical Care Unit and becomes a bit combative with procedures.  He looks much older than his stated age.  VITAL SIGNS: Temperature 97.5, he was hypothermic on admission. His pulse is 118, blood pressure 90/44 and that is on pressors. Respirations are 24.  HEENT: Normocephalic, atraumatic.  EYES: Sclerae anicteric. Conjunctivae are clear.  EARS: Normal external appearance.  Unable to assess hearing.  NECK: Supple. No  adenopathy or JVD. HEART: Tachycardic but regular.  LUNGS: Diminished and somewhat coarse bilaterally.  ABDOMEN: He has a colostomy present in the left lower abdomen.  Soft, nondistended. EXTREMITIES: High right above-knee amputation present.  SKIN: Sacral wound which is not evaluated and currently dressed. NEUROLOGIC:  Somnolent and not oriented.   LABORATORY DATA: Sodium 134, potassium 6.4, chloride 103, CO2 is 9, BUN 157, creatinine 11.6, glucose 104.  White blood cell count 26.6, hemoglobin 7.0, platelet count 480,000.   ASSESSMENT AND PLAN: This is a critically ill, 54 year old male who needs a dialysis catheter and arterial line. These will be placed at bedside. This is a level-3 consultation.    ____________________________ Martin NeedyJason S. Leanora Murin, MD jsd:ct D: 01/10/2012 08:49:02 ET T: 01/10/2012 12:31:11 ET JOB#: [id number]  cc: Martin NeedyJason S. Callaway Hardigree, MD, <Dictator> Martin NeedyJASON S Idelia Caudell MD ELECTRONICALLY SIGNED 01/11/2012 14:14

## 2014-04-27 NOTE — Op Note (Signed)
PATIENT NAME:  Martin Jenkins, Martin Jenkins MR#:  [id number] DATE OF BIRTH:  1960/03/22  DATE OF PROCEDURE:  01/12/2012  PREOPERATIVE DIAGNOSIS: Septic left hip with abscess.   POSTOPERATIVE DIAGNOSIS: Septic left hip with abscess.  PROCEDURE: Incision and drainage of left hip abscess.   ANESTHESIA: Sedation.  SURGEON: Leitha SchullerMichael J. Prajwal Fellner, MD.   DESCRIPTION OF PROCEDURE: The patient was brought to the operating room, and after sedation was given, the hip was prepped and draped in the usual sterile fashion. A bump was placed behind the knee because of a significant flexion contracture. After patient identification and timeout procedures were completed, an anterolateral approach to the hip was made with incision over the medial border of the tensor fascia. Skin and subcutaneous tissue were divided, and there was moderate edema present. The muscles were fibrosed secondary to the patient's history of paralysis. The plane was entered between the rectus, and staying lateral to this, the capsule was identified and incised. There was a large amount of purulent fluid present, and this was cultured. After opening up the hip and thoroughly irrigating it, there was a fairly large bone fragment, with it being apparently a chronic osteomyelitis having destroyed the hip previously. This bone was sent as a specimen to make sure this was infection and not some other destructive process. After irrigating the hip joint out with 3 liters of pulsatile lavage, additional 3 liters of saline was utilized, and there did not appear to be any further purulent material present. The silver foam for the wound VAC was then applied into the wound and the wound VAC applied. The patient was then sent back to CCU in stable condition.   ESTIMATED BLOOD LOSS: 100 mL.   COMPLICATIONS: None.   SPECIMEN: Deep culture, left hip abscess, and bone from left hip, presumed osteomyelitis.   ____________________________ Leitha SchullerMichael J. Shaughnessy Gethers,  MD mjm:OSi D: 01/12/2012 23:09:00 ET T: 01/13/2012 05:56:32 ET JOB#: [id number]  cc: Leitha SchullerMichael J. Coltyn Hanning, MD, <Dictator> Leitha SchullerMICHAEL J Kamori Kitchens MD ELECTRONICALLY SIGNED 01/13/2012 8:30

## 2014-04-27 NOTE — Consult Note (Signed)
Brief Consult Note: Diagnosis: left hip abcess.   Patient was seen by consultant.   Comments: cont wound vac.  Electronic Signatures: Leitha SchullerMenz, Tyla Burgner J (MD)  (Signed 24-Jan-14 15:54)  Authored: Brief Consult Note   Last Updated: 24-Jan-14 15:54 by Leitha SchullerMenz, Dragan Tamburrino J (MD)

## 2014-04-27 NOTE — Op Note (Signed)
PATIENT NAME:  Aleen CampiFLECKENSTEIN, Del MR#:  [id number] DATE OF BIRTH:  [birth year]  DATE OF PROCEDURE:  01/10/2012  PREOPERATIVE DIAGNOSIS: Sepsis.  POSTOPERATIVE DIAGNOSIS: Sepsis.  OPERATION PERFORMED: Left subclavian vein central venous catheter placement.   SURGEON: Claude MangesWilliam F Kem Hensen, M.D.   ANESTHESIA: Local.   PROCEDURE IN DETAIL: The patient was placed in the Trendelenburg position and a triple lumen left subclavian vein central venous catheter was placed utilizing the double Seldinger technique and an 18-gauge thin wall needle. The catheter was positioned to 21 cm from the skin and secured to the skin with 3-0 silk sutures and a sterile dressing was applied. The patient tolerated the procedure well. There were no complications.     ____________________________ Claude MangesWilliam F. Leaf Kernodle, MD wfm:es D: 01/10/2012 20:46:11 ET T: 01/11/2012 08:56:12 ET JOB#: [id number]  cc: Claude MangesWilliam F. Santi Troung, MD, <Dictator> Claude MangesWILLIAM F Lopez Dentinger MD ELECTRONICALLY SIGNED 01/11/2012 20:35

## 2014-04-27 NOTE — Consult Note (Signed)
Chief Complaint:   Chief Complaint Agitation   Presenting Symptoms:   Presenting Symptoms Anger/irritability  Impaired Concentration   History of Present Illness:   History of Present Illness The patient is a 54 year old Caucasian male, who resides in [facility] who was sent over to the hospital ER regarding altered mental status. The patient has a chronic history of paraplegia status post accident in 1990 after falling from a third story window and fracturing in his spine and rupturing his bladder. The patient has a chronic indwelling Foley catheter and has had multiple hospitalizations and sacral wounds for the past 8 years. The patient has colostomy bag as well. 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 Pt admitted due to altered mental status. The patient was confused, agitated and belligerent and stated that he wanted to kill himself.  Initially he was brought in here for behavioral health evaluation but the patient was found to be hypothermic and hypotensive.   During my evaluation, pt remains anxious and was asking for ice chips as he stated that he is very thirsty. He stated that he wants to go home and is in pain.His nursing staff reported that he become agitated often and the palliative care wants him to get morphine more often instead of Haldol and Ativan. He has Stage IV decubitus ulcers as well as he is undergoing surgical procedures for the removal of mass on his left hip. He is currently NPO and cannot take any PO meds. He was not exhibiting any suicidal / homicidal ideations or plans.   Target Symptoms:   Depressive Interest Loss  Appetite Change  Sleep Change  Anhedonia    Manic Impaired Judgment    Psychosis Disorganization  Negative Symptoms  Agitation    Behavior Agitation   Past Psychiatric Treatment: First Treatment: Long h/o Bipolar disorder.  Unable to provide much detail at this time.   History of Suicide Attempts: Attempted by jumping from 3rd story window in 1990.  PAST MEDICAL & SURGICAL HX:  Significant Events:   Multi-drug Resistant Organism (MDRO): Positive culture for MRSA., 09-Jan-2012   sepsis:    DMII:    hypokalemia:    Paraplegia:    MRSA:    UTI:    pressure ulcers:    colostomy:   CURRENT OUTPATIENT MEDICATIONS:  Home Medications: Medication Instructions Status  Ultram 50 mg oral tablet 1 tab(s) orally every 6 hours, As Needed- for Pain  Active  Seroquel 200 mg oral tablet 1 tab(s) orally 3 times a day (9 am, 2 pm, 9 pm) Active  NovoLog Mix 70/30 FlexPen subcutaneous suspension 30 unit(s) subcutaneous once a day (in the morning) (630 am) Active  Levemir FlexPen 100 units/mL subcutaneous solution 30 unit(s) subcutaneous once a day (in the evening) (9 pm) Active  multivitamin with minerals 1 tab(s) orally once a day (9 am) Active  Vitamin C 500 mg oral tablet 1 tab(s) orally 2 times a day (9 am, 6 pm) Active  lisinopril 5 mg oral tablet 1 tab(s) orally once a day (9 am) Active  cyanocobalamin 500 mcg oral tablet 2 tab(s) (1000 mcg) orally once a day (9 am) Active  carvedilol 3.125 mg oral tablet 1 tab(s) orally 2 times a day (9 am, 6 pm) Active  busPIRone 10 mg oral tablet 1 tab(s) orally every 8 hours (6 am, 2 pm, 10 pm) Active  trazodone 25 milligram(s)  orally once a day (at bedtime), As Needed for sleep Active  MiraLax oral powder for reconstitution 17 gram(s) orally once a day (9 am) Active  mirtazapine 30 mg oral tablet 1 tab(s) orally once a day (at bedtime) (8 pm) Active  acetaminophen 650 mg oral tablet, extended release 1 tab(s) orally every 6 hours, As Needed- for Fever  Active  ferrous sulfate 325 mg (65 mg elemental iron) oral tablet 1 tab(s) (5 grains) orally 3 times a day (with meals) (9 am, 1 pm, 6 pm) Active  Colace 100 mg oral capsule 1 cap(s) orally 2 times a day, As Needed- for  Constipation  Active  alprazolam 0.5 mg oral tablet 1 tab(s) orally 2 times a day, As Needed for anxiety Active   Mental Status Exam:   Mental Status Exam Moderately built male lying in bed.    Speech Slowed    Mood Irritable    Affect Constricted    Thought Processes Disorganized    Orientation Self    Attention Drowsy  Lethargic    Concentration Poor    Memory Impaired    Fund of Knowledge Poor    Judgement Poor    Insight Poor   Suicide Risk Assessment: Suicide Risk Level No risk inidicated.  Review of Systems:  Review of Systems:   Medications/Allergies Reviewed Medications/Allergies reviewed   NURSING FLOWSHEETS:  Vital Signs/Nurse Notes-CM: ED Vital Sign Flow Sheet:   05-Jan-14 01:30   Pulse Pulse 98   Respirations Respirations 20   SBP SBP 75   DBP DBP 35   Pulse Ox % Pulse Ox % 98   Pulse Ox Source Source Room Air  Transfer Form (Outpatient in Facility):   07-Jan-14 13:20   Temperature Temperature (F) 97.7   Temperature Source axillary   Pulse Pulse 94   Respirations Respirations 22   Systolic BP Systolic BP 112   Diastolic BP (mmHg) Diastolic BP (mmHg) 59   Pulse Ox % Pulse Ox % 97  **Vital Signs.:   07-Jan-14 13:20   Vital Signs Type Blood Transfusion Complete   Temperature Temperature (F) 97.7   Celsius 36.5   Temperature Source axillary   Pulse Pulse 94   Pulse source if not from Vital Sign Device per cardiac monitor   Respirations Respirations 22   Systolic BP Systolic BP 112   Diastolic BP (mmHg) Diastolic BP (mmHg) 59   Mean BP 76   BP Source  if not from Vital Sign Device non-invasive   Pulse Ox % Pulse Ox % 97   Oxygen Delivery Room Air/ 21 %   Treatment Plan: Pt is in CCU.  His wife is his POA and she has decided to make him DNR.  He is currently getting Lorazepam 1 mg q4hrs prn for agitation. Haldol 1-2 mg IV q4hrs prn for agitation. He is also being monitored closely by Palliative care.  No change in current meds as pt is  currently NPO.  If his condition gets worse, please reconsult Psychiatry.  Thank you for allowing me to participate in the care of this pt.  Electronic Signatures: Rhunette Croft (MD)  (Signed 07-Jan-14 13:40)  Authored: Chief Complaint, Presenting Symptoms, History of Present Illness, Target Symptoms, Past Psychiatric Treatment, PAST MEDICAL & SURGICAL HX, CURRENT OUTPATIENT MEDICATIONS, Mental Status Exam, Suicide Risk Assessment, Review of Systems, NURSING FLOWSHEETS, Treatment Plan   Last Updated: 07-Jan-14 13:40 by Rhunette Croft (MD)

## 2014-04-27 NOTE — Op Note (Signed)
PATIENT NAME:  Martin Jenkins, Martin Jenkins MR#:  [id number] DATE OF BIRTH:  06-Aug-1960  DATE OF PROCEDURE:  01/10/2012  PREOPERATIVE DIAGNOSES:  1.  Hypotensive shock requiring pressors.  2.  Septic shock.  3.  Acute renal failure.  4.  Hyperkalemia.   POSTOPERATIVE DIAGNOSES:  1.  Hypotensive shock requiring pressors.  2.  Septic shock.  3.  Acute renal failure.  4.  Hyperkalemia.   PROCEDURES:  1.  Ultrasound guidance for vascular access, left radial artery.  2.  Left radial arterial line placement.   SURGEON: Algernon Huxley, MD.   ANESTHESIA: None.   ESTIMATED BLOOD LOSS: Minimal.   INDICATION FOR PROCEDURE: A 54 year old white male who was admitted to the Critical Care Unit in septic shock with hypotension on multiple pressors.  We are asked to place an arterial line for hemodynamic monitoring and frequent blood draws.   DESCRIPTION OF PROCEDURE: The patient's left wrist was prepped with Betadine. Ultrasound was used to access the left radial artery. This was done with a mild amount of difficulty mostly due to patient compliance issues, and continuous motion.  Once it was accessed with the radial arterial line kit, a wire was advanced into the radial artery and an arterial line catheter was advanced over the wire with good pulsatile return of blood. Wire was removed. The arterial line was secured with a sterile dressing.    ____________________________ Algernon Huxley, MD jsd:th D: 01/10/2012 08:43:22 ET T: 01/10/2012 19:03:13 ET JOB#: [id number]  cc: Algernon Huxley, MD, <Dictator> Algernon Huxley MD ELECTRONICALLY SIGNED 01/11/2012 14:14

## 2014-04-27 NOTE — Consult Note (Signed)
PATIENT NAME:  Martin Jenkins, Martin Jenkins MR#:  [id number] DATE OF BIRTH:  05-21-1960  DATE OF CONSULTATION:  01/11/2012  REFERRING PHYSICIAN:  Dr. Darvin Neighbours.  CONSULTING PHYSICIAN: Heinz Knuckles. Andy Moye, M.D.   REASON FOR CONSULTATION: Staph sepsis and possible osteomyelitis.   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 The patient has been in a nursing home. He is nonambulatory and has no sensation below the chest. He had altered mental status at the nursing home and was sent to the hospital. He is awake and stating that he is both hungry and thirsty but was not a very good historian and unable to provide any history of any recent events. Per the H and P, he was seen in the Emergency Room and found to be extremely hypotensive and hypothermic. He was given multiple boluses of IV fluids and did not have significant urine output initially. He subsequently had a blood culture, which became positive very quickly for Staph aureus in both sets. He was initially treated with vancomycin, aztreonam and metronidazole. This was subsequently changed to meropenem and vancomycin. A CT scan has shown a destructive lesion in the left hip. Per the H  and P, he was apparently treated in September 2013 for right ischial and sacral decubitus with osteomyelitis. This was done at an outside hospital and apparently debridement was done and cultures were obtained, which grew MRSA  and pseudomonas. The patient was apparently treated for with vancomycin and aztreonam at that time, although it is not clear how long he was on therapy and when the therapy finished. Currently, the patient complains only of being hungry and thirsty. He denies any fevers, chills or sweats. He has no pain but is insensate below the chest.    ALLERGIES: INCLUDE BENADRYL, MACROBID, PENICILLIN AND  SULFAMETHOXAZOLE.   PAST MEDICAL HISTORY: 1.  Paraplegia related to a fall with a fall in 1990. He has had a chronic Foley catheter since that time and has had multiple hospitalizations for sacral wounds.   2.  He was recently admitted in September 2013 at another hospital for chronic osteomyelitis on the right and apparently received IV antibiotics. It is unclear how long he received therapy for.  3.  Diabetes.  4.  Status post colostomy.  5.  Right AKA, although the CT scan indicates that he has had a disarticulation of the right hip.   SOCIAL HISTORY: The patient is a nursing home resident. He is married.   FAMILY HISTORY: Unable to obtain from patient.   REVIEW OF SYSTEMS:   GENERAL:  The patient denied any specific complaints other than being hungry or thirsty. He appeared to be somewhat confused. He denied any pains anywhere, although during the physical, he indicated that he was sensitive everywhere.  RESPIRATORY:  He denied any shortness of breath or cough.  CHEST:  He had no chest pains.  ABDOMEN:  He had no change in his bowels, although he did say he had not had much output from his ostomy recently.  EXTREMITIES:  He denied any upper extremity complaints.   PHYSICAL EXAMINATION: VITAL SIGNS: T-max of 99.2, T-current of 97.5, pulse 82, blood pressure 114/87, 96% on room air.  GENERAL: A 54 year old white man, confused but in no acute distress.  HEENT: Normocephalic, atraumatic. Pupils equal and reactive to light. Extraocular  motion intact. Sclerae, conjunctivae and lids without evidence for emboli or petechiae. Oropharynx shows no erythema or exudate. Gums are in fair condition.  NECK: Supple. Full range of motion. Midline trachea. No lymphadenopathy. No thyromegaly.  LUNGS: Clear to auscultation bilaterally with good air movement. No focal consolidation.  HEART: Regular rate and rhythm without murmur, rub or gallop.   ABDOMEN: Soft, nondistended. No hepatosplenomegaly. No hernias noted. He was insensate in the abdomen.  EXTREMITIES: Status post amputation of the right leg. He was unable to move the left leg due to his chronic per paraplegia. Upper extremities were moving without problem and had no evidence for tenosynovitis.  SKIN: No rashes. No stigmata of endocarditis. He was somewhat combative and wanting to eat, and full exam of the prior pelvic decubitus was not possible.  NEUROLOGIC: The patient was awake but confused. He was moving his upper extremities. He was not moving his lower extremities. He had decreased sensations below the nipples bilaterally.  PSYCHIATRIC: At times, he was very tearful and crying; at other times, somewhat belligerent.   LABORATORY DATA:  Shows a BUN of 102, creatinine of 6.08, bicarbonate of 18 with an anion gap of 16. On admission, his BUN was 157 with a creatinine of 11.63, bicarbonate of 9 and an anion gap of 22. LFTs on admission showed an AST of 21, ALT of 7, alk phos of 139, total bilirubin 0.7. White count today was 20.8 with a hemoglobin of 7.4, platelet count of 502, ANC of 20.0. White count on admission was 26.6. Blood cultures from admission are growing Staph aureus in both sets. The wound culture is growing gram-positive cocci, gram-negative rod and a group B strep that is labeled decubitus ulcer. A second culture labeled sacrum is growing group B strep and gram-negative rod. A urinalysis had negative nitrites, 3+ leukocyte esterase, 110 red cells, 217 white cells, and the urine culture is growing greater than 1000 CFU/mL of a gram-negative rod.   IMAGING:  A chest x-ray showed no acute infiltrates. CT scan of the head showed no acute abnormalities. CT of the abdomen and pelvis without contrast demonstrated mild hydronephrosis and hydroureter bilaterally, a large ulceration extending to the posterior aspect of the right ischial tuberosity, disarticulation of the right hip, a  large destructive mass involving the left hip was also noted. Left hip x-ray showed severe osteolysis of the femoral head and neck.   IMPRESSION: A 54 year old man with a history of paraplegia, diabetes, status post right leg amputation and chronic osteomyelitis of the right pelvis admitted with Staph aureus sepsis and a destructive mass in the left hip and acute renal failure.   RECOMMENDATIONS: 1.  He was recently treated with IV vancomycin and aztreonam for MRSA and pseudomonas osteomyelitis of the right pelvic area. It is unclear how long he was treated and when the antibiotics were stopped. His recent CT shows evidence for a destructive mass on the left. This is likely infection. He has no pain due to his paraplegia.  2.  Surgery is going to take him to the OR tomorrow for debridement.  3.  I agree with broad spectrum antibiotic therapy.  Initial therapy of vancomycin, aztreonam and  Flagyl would have covered anaerobes perfectly well. Meropenem and vancomycin  are also acceptable.   There is an increased risk of seizure using carbapenems in renal failure. We will hopefully be able to narrow the spectrum after surgical cultures are available.  4.  His creatinine is improving. He remains on  pressors.  5.  Would put him on contact isolation given his recent MRSA and the current staph bacteremia.  6.  We will need a transthoracic echo when he is stable. As he has a likely source, we will not need to get a TEE unless his blood cultures remain persistently positive.   7.  We will repeat the blood cultures in 2 days. This is a high-level infectious disease consult.   Thank you very much for involving me in the patient's care.   ____________________________ Heinz Knuckles. Shari Natt, MD meb:dm D: 01/11/2012 14:55:56 ET T: 01/11/2012 19:14:33 ET JOB#: [id number]  cc: Heinz Knuckles. Amila Callies, MD, <Dictator> Juvon Teater E Mikalyn Hermida MD ELECTRONICALLY SIGNED 01/12/2012 12:03

## 2014-04-27 NOTE — H&P (Signed)
PATIENT NAME:  Martin Jenkins, URBAS MR#:  [id number] DATE OF BIRTH:  [birth year]  DATE OF ADMISSION:  01/10/2012  PRIMARY CARE PHYSICIAN:  Toy Cookey, MD  REFERRING PHYSICIAN:  Lucrezia Europe, MD  CHIEF COMPLAINT: Altered mental status.   HISTORY OF PRESENT ILLNESS: The patient is a 54 year old Caucasian male, who resides in [facility] who was sent over to the hospital ER regarding altered mental status. The patient has a chronic history of paraplegia status post accident in 1990 after falling from a third story window and fracturing in his spine and rupturing his bladder. The patient has a chronic indwelling Foley catheter and has had multiple hospitalizations and sacral wounds for the past 8 years. The patient has colostomy bag as well. 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 At that time he also had urinary tract infection with Klebsiella and E. coli.  It was treated with antibiotics.   Wound culture has revealed MRSA during the previous admission which was sensitive to vancomycin and Pseudomonas sensitive to aztreonam. The patient was sent over to the ER because of his altered mental status. The patient was confused, agitated and belligerent and stated that he wanted to kill himself.  Initially he was brought in here for behavioral health evaluation but the patient was found to be hypothermic and hypotensive. The patient's initial rectal temperature was 93.9 with a blood pressure of 59/33, pulse of  62.   Aggressive hydration was given with fluid boluses. The patient has received 3 liters of fluid boluses with no urine output. Chest x-ray revealed no acute findings. Stat CT of the head was ordered which has revealed no acute findings either.  As well, during my examination I could not appreciate any bowel sounds. Stat CT abdomen and pelvis was ordered which has revealed mild  to moderate bilateral hydronephrosis with hydroureter and perirectal thickness and mass in the left hip, which could be left hip septic arthritis as per on-call radiologist, Dr. Milinda Cave preliminary report. Stat blood cultures, wound cultures, urinalysis and urine cultures were ordered. Blood cultures were obtained wound cultures were obtained and the patient was started on IV aztreonam, IV VANCOMYCIN AND IV FLAGYL.  The patient being extremely hypotensive, even after 3 liters of fluid bolus, surgical consult was placed for central line placement and the patient subsequently had a left subclavian central line placement done by Dr. Kirke Shaggy. Initial blood sample was hemolyzed, but subsequently his BMP has revealed BUN 167, creatinine 11.63, potassium 6.4, GFR 5. The patient's previous labs from September 2013 have revealed a BUN of 4, creatinine of 0.4, bicarbonate 25.  During the current admission, white count is elevated at 26.6 with hemoglobin of 7.0, hematocrit 22.6 and platelet count 480,000. Typing and cross matching is done for 2 units of blood transfusion, but as the patient is already hyperkalemic no urine output, the blood transfusion is temporarily held. Stat ABGs were done which have revealed a pH of 7.13, pCO2 25, pO2 106 and bicarbonate 8.3. His lactic acid is 0.4.   The patient is immediately started on bicarbonate drip. Stat nephrology consult is placed and Dr. Wynelle Link is generous and kind enough and came to the hospital and considering CRRT asap. Consult is placed to vascular surgeon, Dr. Wyn Quaker for temporary dialysis catheter placement. The patient has received calcium gluconate 2 grams IV, 10 units of regular IV insulin and D50 as well as  albuterol neb treatments regarding hyperkalemia. The patient also has received Kayexalate in the rectal form. Subsequently, the patient started making urine and he made a 400 mL of urine output so far. Stat urology consult is placed as the patient has mild to  moderate hydronephrosis and hydroureter and I personally have discussed with urology, Dr. Assunta FoundAbern, who thinks the mild to moderate hydronephrosis is from a neurogenic bladder. He personally has been reviewed the CAT scan of the abdomen and pelvis and could not find any bladder obstruction  or ureteral obstruction. Subsequently, the antibiotic aztreonam was discontinued as it did not have good anaerobic coverage and the patient is started on meropenem  The patient's situation is critical. I personally have called the patient's wife at [phone] who has consented for central line, dialysis of any blood transfusion or any other kind of procedures. She  has requested DO NOT RESUSCITATE  as his CODE STATUS.  The patient is opening his eyes to verbal commands mumbling a lot and his speech was unclear. Stat CT of the head did not reveal  any acute findings. I was not able to get any history from the patient and the history is obtained from ER staff, old medical records and also from nursing home staff.   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  PAST SURGICAL HISTORY:  Colostomy, repaired ruptured bladder and right above-the-knee amputation.  ALLERGIES:  1.  BENADRYL. 2.  MACROBID. 3.  PENICILLIN. 4.  SULFAMETHIZOLE. 5.  TRIMETHOPRIM.    HOME MEDICATIONS:  List is not available at this time.  PSYCHOSOCIAL HISTORY:  The patient is residing at [facility] and bedbound from paraplegia.  The patient's wife's name is Martin Jenkins. She is the medical POA and her contact number is [phone]/  FAMILY HISTORY: Unobtainable as the patient is with altered mental status.   REVIEW OF SYSTEMS:  Also unobtainable in view of altered mental status.   PHYSICAL EXAMINATION: VITAL SIGNS: Temperature initially 93.9  rectally.  After being here, his temperature went up to 97.6 axillary.  Pulse initially was 62 and subsequently it went up to 102. Respirations 18 to 20, blood pressure 59/33.  Currently it is at 72/38 with a pulse ox of 98%.  GENERAL APPEARANCE: Moderately built and moderately nourished, not in acute distress. The patient is confused.  HEENT: Normocephalic. Pupils are equally reacting to light and accommodation. No conjunctival injection. No pharyngeal edema. Tympanic membranes are intact. Dry mucous membranes.  NECK:  Supple. No JVD in fact, the neck veins are flat. No thyromegaly.  LUNGS: Clear to auscultation bilaterally. No accessory muscle use and no anterior chest wall tenderness.  GASTROINTESTINAL: Soft.  Bowel sounds are barely heard, nontender. Colostomy bag is intact. Midline incision is healing well.  NEUROLOGIC: Arousable, but with altered mentation, not following verbal commands.  PSYCHOLOGICAL: As the patient had altered mental status, mood and affect could not be commented. INTEGUMENTARY: The patient has a huge right ischial decubitus ulcer which is stage IV.  The size is approximately 15 cm by 10 cm.  Also, a sacral decubitus ulcer stage III to IV covered with fecal matter, erythematous with bloody discharge of the right ischial decubitus ulcer.  EXTREMITIES: A right above-the-knee amputation is present. Chronic indwelling Foley catheter is noticed.  MUSCULOSKELETAL: No joint effusions are noticed labs.  LABORATORY AND DIAGNOSTIC DATA:  CT of  the abdomen and pelvis has remained indeterminate.  Bilateral hydronephrosis, likely due to reflux disease, large ulceration or  infection of the right posterior aspect extending to the posterior iliac crest, disarticulation of the right hip, large amounts of a destructive process of the left hip.  CT head has revealed no acute intracranial pathology.  ABGs has revealed pH 7.13, pCO2 25, pO2 106, lactic acid 0.4, base excess -19.3, bicarb 8.3,  glucose 104, BUN 157 and creatinine at 11.63, sodium 134, potassium 6.4, chloride 103, CO2 9, GFR 4, anion gap 22, serum osmolality 3.3, calcium 7.7, total protein 7.8, albumin 1.3, bilirubin 0.7, alkaline phosphatase 139, SGOT 21, SGPT 27. Total CK 101. WBC 26.6, hemoglobin 7.0, hematocrit 22.6, platelet count 480,000, MCV 94.   ASSESSMENT AND PLAN:  1. Septic shock with systemic inflammatory response syndrome, metabolic encephalopathy, metabolic acidosis, probably from infected sacral and Stage IV right ischial decubitus ulcer with probable left hip septic arthritis.   a.  Pan cultures were done and IV antibiotics, aztreonam 1 dose, Levaquin and Flagyl were given. Will continue meropenem, which would have gram-negative as well as anaerobic coverage. Vancomycin for MRSA and gram-positive coverage and Flagyl for anaerobic coverage and aggressive hydration with IV fluids. We started him on bicarbonate drip for metabolic acidosis and will continue that.  b.  Dopamine drip was ordered initially as the patient does not have central line access to support his blood pressure and will start him on Levophed as soon as there is central line placement.  c. Critical care as well as infectious disease consults were placedd  2..  Acute kidney failure with hyperkalemia. The patient has received calcium gluconate, 10 units of IV regular insulin, D50, albuterol neb treatment and Kayexalate via rectally. No EKG changes were noticed. Will give him aggressive hydration with IV fluids. Nephrology consult is placed and they are considering CRRT as soon as possible. A vascular surgeon consult is placed regarding temporary dialysis catheter  placement. I personally called and notified Dr. Wyn Quaker. Will continue bicarbonate GTT.  3.  Mild to moderate bilateral hydronephrosis with hydroureter.  Urology consult is placed to Dr. Assunta Found who personally has reviewed the CT scan of the abdomen and reported that there is no bladder or urethral  obstruction. There is mild to moderate bilateral hydronephrosis probably from neurogenic bladder as the patient has chronic indwelling Foley catheter.  4.  The left hip mass is probably left hip septic arthritis.  We will continue meropenem and vancomycin and Flagyl and ortho consult is placed.  Wound care consult is placed regarding decubitus ulcer and colostomy bag care. Surgical consult is placed for possible wound debridement.  5.  History of diabetes mellitus. The patient will be getting Accu-Cheks and sliding scale coverage.  6.  Paraplegia from previous accident and bladder rupture which was repaired and the patient has chronic indwelling Foley catheter.  We will replace the indwelling Foley catheter with a new one.   As per my discussion with the patient's wife, the medical power of attorney CODE STATUS as DO NOT RESUSCITATE.  Her contact phone number is [phone]/  CONDITION: Critical.   Total critical care time spent is greater than 120 minutes. The diagnosis and plan of care was discussed in detail with the patient's wife via telephone and she verbalized understanding of the plan.  The patient's wife does not have any transportation to come to the hospital, but she can be reached on the phone.   The patient's CODE STATUS: DO  NOT RESUSCITATE confirmed by me as well as nursing supervisor, Ms.Beth, who is on call.    ____________________________ Ramonita Lab, MD ag:ct D: 01/10/2012 05:09:19 ET T: 01/10/2012 07:07:59 ET JOB#: [id number]  cc: Ramonita Lab, MD, <Dictator> Serita Sheller. Maryellen Pile, MD Laverda Sorenson, MD  Ramonita Lab MD ELECTRONICALLY SIGNED 01/11/2012 0:39

## 2014-04-27 NOTE — Discharge Summary (Signed)
PATIENT NAME:  Martin Jenkins, Martin Jenkins MR#:  [id number] DATE OF BIRTH:  11/13/1960  DATE OF ADMISSION:  06/22/2012 DATE OF DISCHARGE:  06/23/2012   DISCHARGE DIAGNOSES:  1. Acute on chronic kidney disease, now end-stage renal disease, with severe hyperkalemia, not a hemodialysis candidate, and the patient refuses also. He is in agreement not to get dialysis.  2. Severe hyperkalemia, status post insulin and D50 along with calcium gluconate. The patient is refusing all the blood tests while here, so cannot recheck his potassium.  3. Systemic inflammatory response syndrome with tachycardia and leukocytosis, on antibiotic, likely improving. This could be just Colonisation.  4. Paraplegia with bedbound status with chronic Foley.  5. Chronic stage IV decubitus ulcer.  6. Severe protein calorie malnutrition, on Megace.    SECONDARY DIAGNOSES:  7. Chronic stage IV sacral decubitus.  8. Bipolar disorder. 9. Depression/anxiety. 10. Type 2 diabetes.  11. Hypertension.  12. History of labile mood disorder.  13. Above-knee amputation. 14. Chronic osteomyelitis with chronic sacral ulcer.  15. Chronic indwelling Foley with recurrent urinary tract infections in the past.  16. Morbid obesity.   CONSULTATIONS:  1. Palliative care, Dr. Harvie Junior.  2. Nephrology, Dr. Thedore Mins.   PROCEDURES AND RADIOLOGY: None.   LABORATORY DATA: UA showed 3+ bacteria, 1496 WBCs, 2+ leukocyte esterase.   HISTORY AND SHORT HOSPITAL COURSE: The patient is a 54 year old male with above-mentioned medical problems, who was admitted for acute on chronic kidney disease, appeared to be in end-stage renal disease with severe hyperkalemia. Nephrology consultation was obtained with Dr. Thedore Mins, but the patient was deemed not a candidate for dialysis. The patient also refused. He was refusing all the blood work and wanted to be just comfortable and not getting treated. Please see Dr. Jearl Klinefelter Patel's dictated history and physical for further  details. The patient was given insulin plus D50 to treat his hyperkalemia along with calcium gluconate. He was also found to have urinary tract infection based on UA and was started on antibiotic, but the patient kept refusing IV medications and also IV line, due to which his antibiotics were switched to oral form and he is being discharged back to his facility as he was evaluated by palliative care, who recommended hospice, under which he is going back to [facility]. The patient is very clear that he would not want to get treated, neither get IV, and has been refusing blood draws while here. I discussed the case with his primary care physician at [facility], Dr. Maryellen Pile, and recommended to avoid hospitalization if at all possible and keep him comfortable while at the facility.   PERTINENT PHYSICAL EXAMINATION ON THE DATE OF DISCHARGE:  VITAL SIGNS: On the date of discharge, his vital signs were as follows: Temperature 97.7, heart rate 98 per minute, respirations 20 per minute, blood pressure 105/66 mmHg, he is saturating 98% on room air.  CARDIOVASCULAR: S1, S2 normal. No murmurs, rubs or gallop.  LUNGS: Clear to auscultation bilaterally. No wheezing, rales, rhonchi or crepitation.  ABDOMEN: Soft, benign.  NEUROLOGIC: He has paraplegia along with a right above-knee amputation.  All other physical examination remained at baseline.  DISCHARGE MEDICATIONS:  1. Colace 100 mg p.o. b.i.d. as needed.  2. Tylenol 650 mg p.o. every 6 hours as needed.  3. Insulin Levemir 15 units subcutaneous q.h.s.   4. Buspirone 10 mg p.o. every 8 hours. 5. Carbamazepine 200 mg p.o. b.i.d.  6. Protonix 40 mg p.o. b.i.d.  7. Senna S 1 tablet p.o.  b.i.d. 8. Alprazolam 0.5 mg p.o. b.i.d. as needed.   9. Zyprexa 10 mg p.o. b.i.d.  10. Zolpidem 10 mg p.o. q.h.s. as needed.  11. Mirtazapine 30 mg p.o. q.h.s.  12. Insulin NovoLog sliding scale subcutaneously.  13. Keflex 250 mg p.o. 4 times a day for 5  days.   DISCHARGE DIET: An 1800 ADA.    DISCHARGE ACTIVITY: As tolerated.   DISCHARGE INSTRUCTIONS AND FOLLOWUP:  The patient was instructed to follow up with his primary care physician, Dr. Maryellen PileEason, in 1 to 2 weeks. He will be followed by hospice while at the facility. He was instructed to avoid hospitalization if at all possible as he is refusing all kind of care, including blood work, IV medicine. This was also conveyed to his primary care physician at [facility], Dr. Maryellen PileEason, and he is in agreement. His care should be just to keep him comfortable and avoid any blood work even while at the facility. He refuses dialysis at this point.   TOTAL TIME DISCHARGING THIS PATIENT: 55 minutes.   ____________________________ Ellamae SiaVipul S. Sherryll BurgerShah, MD vss:OSi D: 06/23/2012 13:59:42 ET T: 06/23/2012 14:17:12 ET JOB#: [id number]  cc: Suleyman Ehrman S. Sherryll BurgerShah, MD, <Dictator> Mosetta PigeonHarmeet Singh, MD Ned GraceNancy Phifer, MD Ellamae SiaVIPUL S [facility] MD ELECTRONICALLY SIGNED 06/26/2012 15:09

## 2014-04-27 NOTE — Consult Note (Signed)
Brief Consult Note: Diagnosis: Minimal R hydronephrosis. Neurogenic bladder. Chronic Foley. Recurrent UTI's.   Patient was seen by consultant.   Consult note dictated.   Discussed with Attending MD.   Comments: Urological intervention is not indicated at this time. It appears that changing the Foley has solved the problem.  Continue monthly exchanges.  Electronic Signatures: Orson ApeWolff, Francely Craw R (MD)  (Signed 06-Jan-14 10:26)  Authored: Brief Consult Note   Last Updated: 06-Jan-14 10:26 by Orson ApeWolff, Antonious Omahoney R (MD)

## 2014-04-27 NOTE — Consult Note (Signed)
PATIENT NAME:  Aleen CampiFLECKENSTEIN, Rowdy MR#:  [id number] DATE OF BIRTH:  January 22, 1960  DATE OF CONSULTATION:  01/11/2012  REFERRING PHYSICIAN:   CONSULTING PHYSICIAN:  Leitha SchullerMichael J. Daly Whipkey, MD  REASON FOR CONSULTATION: Possible septic left hip.   HISTORY OF PRESENT ILLNESS: The patient is a 54 year old male who suffered a fall 20 years ago, suffering quadriplegia. He has been in a nursing home for care.  He came in approximately a day and a half ago in apparent shock. He has had evaluation and there is suspicion for a septic hip with infection based on CT.  The patient has been confused and remains confused and is a poor historian.  PHYSICAL EXAMINATION:  On exam he has swelling to the anterior thigh and hip.  The left leg is shortened and externally rotated. He does not have any sensation.  His skin is intact anteriorly and laterally.  His CT shows destruction of the femoral head with large anterior soft tissue mass consistent with abscess. 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   CLINICAL IMPRESSION: Septic arthritis with destruction of femoral head.   RECOMMENDATION: Irrigation and debridement of left hip.  As this would normally be an emergency surgery to preserve articular cartilage as the femoral head has been destroyed and he has needed pressors, I think this is less urgent and probably can wait until he is more stable for anesthesia purposes and could plan on surgery.  Right now, I will tentatively plan on surgery tomorrow morning.    ____________________________ Leitha SchullerMichael J. Charlesa Ehle, MD mjm:ct D: 01/11/2012 12:18:52 ET T: 01/11/2012 13:11:29 ET JOB#: [id number]  cc: Leitha SchullerMichael J. Hristopher Missildine, MD, <Dictator> Leitha SchullerMICHAEL J Raul Torrance MD ELECTRONICALLY SIGNED 01/11/2012 16:01

## 2014-04-27 NOTE — Op Note (Signed)
DATE OF BIRTH:  September 28, 1960  DATE OF PROCEDURE:  01/10/2012  PREOPERATIVE DIAGNOSES:  1.  Acute renal failure.  2.  Hyperkalemia.  3.  Septic shock requiring pressors.   POSTOPERATIVE DIAGNOSES:  1.  Acute renal failure.  2.  Hyperkalemia.  3.  Septic shock requiring pressors.  PROCEDURE:  1.  Ultrasound guidance for vascular access, right femoral vein.  2.  Placement of right femoral Trialysis-type dialysis catheter.   SURGEON:  Annice NeedyJason S. Dew, MD    ANESTHESIA:  Local.   BLOOD LOSS:  Minimal.   INDICATION FOR PROCEDURE:  This is a 54 year old white male with multiple ongoing issues, who was admitted with septic shock, hyperkalemia and acute renal failure. He is going to be started on CRRT immediately, so a dialysis catheter is requested.   DESCRIPTION OF PROCEDURE:  The patient was laid flat on the critical care bed. The right groin was sterilely prepped and draped, and a sterile surgical field was created. The right femoral vein was visualized with ultrasound and found to be patent. It was then accessed under direct ultrasound guidance without difficulty with a Seldinger needle. A J-wire was then placed. After a skin nick and dilatation, the 30-cm long dual-lumen DuoGlide catheter was placed over the wire, and the wire was removed. Both ports withdrew blood well and flushed easily with sterile saline. It was secured at the skin at 29 cm with 3 nylon sutures.     ____________________________ Annice NeedyJason S. Dew, MD jsd:ms D: 01/10/2012 08:40:39 ET T: 01/10/2012 19:32:39 ET JOB#: [id number]  cc: Annice NeedyJason S. Dew, MD, <Dictator> Annice NeedyJASON S DEW MD ELECTRONICALLY SIGNED 01/11/2012 14:15

## 2014-04-27 NOTE — Consult Note (Signed)
Patient admitted with hypotension, septic shock, ARF.  We are asked to place art line and dialysis catheter.  Done at bedside  Electronic Signatures: Annice Needyew, Javonni Macke S (MD)  (Signed on 05-Jan-14 08:35)  Authored  Last Updated: 05-Jan-14 08:35 by Annice Needyew, Calyssa Zobrist S (MD)

## 2014-04-27 NOTE — Discharge Summary (Signed)
PATIENT NAME:  Martin Jenkins, Octavis MR#:  [id number] DATE OF BIRTH:  1960/11/27  DATE OF ADMISSION:  05/20/2012 DATE OF DISCHARGE:  06/03/2012.  Please refer to the interim summary dictated on 05/26/2012 by Dr. Renae GlossWieting and on 06/02/2012 dictated by Dr. Auburn BilberryShreyang Patel. The patient is being discharged on 06/03/2012.    DISCHARGE DIAGNOSES:  1.  Acute renal failure on chronic kidney disease with nephrotic range proteinuria. 2.  Anemia of chronic disease.  3.  Systemic inflammatory response syndrome. 4.  Paraplegia.  5.  Diabetes.  6.  Anxiety and chronic pain.  7.  Stage IV decubitus ulcers.  8.  Severe caloric protein malnutrition.  9.  Left kidney cyst.  CONSULTS: 1.  Wound care.  2.  Palliative Care.  3.  Dr. Cherylann RatelLateef for Nephrology.  4.  Dr. Achilles Dunkope.  LABORATORIES AT DISCHARGE:  Sodium 141, potassium 4.0, chloride 115, bicarb 20, BUN 24, creatinine 4.20, glucose is 82, hemoglobin 8.6, protein to creatinine ratio 11,835.   CT of the abdomen and pelvis showed a small low attenuation mass in the left kidney, may represent a cyst.   HOSPITAL COURSE:  This is a 54 year old male with a history of paraplegia, chronic Foley who presented with abnormal labs from [facility], found to have acute renal failure and hyperkalemia. For further details again, please refer to the already H and P and interim summaries already dictated on May 22nd and May 29th.  1.  Acute renal failure on chronic kidney disease. The patient was initially felt to have ATN plus nephrotic range proteinuria. His creatinine is elevated at discharge. He will have close followup at the facility for his creatinine. He will also have followup outpatient Nephrology. At this time, he may progress to end-stage renal disease. This was discussed with Dr. Cherylann RatelLateef as well as Dr. Harvie JuniorPhifer from Palliative Care with the patient. The patient is still wavering if he would like dialysis or not. At this time, his creatinine is elevated, however, stable  elevated and there is no acute indication for acute dialysis. So the patient, therefore, will be discharged with close followup with Dr. Wynelle LinkKolluru.  2.  Anemia of chronic disease. The patient did receive 1 unit of transfusion here. He will continue on iron. He was on Procrit here.  3.  SIRS with tachycardia and leukocytosis, resolved.  4.  Paraplegia.  5.  Stage IV decubitus ulcers. We appreciate Wound consult. W will continue with wound care as per their recommendations.  6.  Diabetes. His blood sugar are stable.  7.  Anxiety and chronic pain. The patient will continue on Xanax and BuSpar.  8.  Severe caloric protein malnutrition:  Albumin is 2.0. He is on Megace, nutritionist was following while in the hospital.   DISCHARGE MEDICATIONS:  1.  Colace 100 mg b.i.d. p.r.n.  2.  Cyanocobalamin 500 mcg 2 tablets daily.  3.  Multivitamin 1 tablet daily.  4.  Acetaminophen 650 mg 6 hours p.r.n. fever.  5.  Levemir 15 units at bedtime.  6.  Buspirone 10 mg every 8 hours.  7.  Ferrous sulfate 325 mg b.i.d.  8.  Carbamazepine 200 mg b.i.d.  9.  Ensure Plus liquid 237 mL daily.  10.  Juven one packet b.i.d.  11.  Pantoprazole 40 mg b.i.d.  12.  Senna 1 tablet daily.  13.  Humalog 75/25, 25 units before breakfast. 14.  Xanax 0.5 mg b.i.d. p.r.n. anxiety.  15.  Zyprexa 10 mg b.i.d.  16.  Ambien 10 mg  at bedtime.  17.  Citric acid and sodium citrate 30 mL b.i.d.  18.  Megace 10 mL daily.   DISCHARGE DRESSING:  Clean with soap and water daily and apply calcium alginate followed by border dry dressing.   DISCHARGE DIET:  Regular ADA diet.   DISCHARGE FOLLOWUP:  The patient will need to follow up with Dr. Wynelle Link in 1 week and Dr. Maryellen Pile in 1 week. The patient will be discharged with Hospice.   TIME SPENT:  Approximately 40 minutes. The patient is medically stable for discharge.   ____________________________ Modean Mccullum P. Juliene Pina, MD spm:jm D: 06/03/2012 14:32:59 ET T: 06/03/2012 14:50:31  ET JOB#: [id number]  cc: Karrissa Parchment P. Juliene Pina, MD, <Dictator> Laverda Sorenson, MD Serita Sheller Maryellen Pile, MD Janyth Contes Zayvian Mcmurtry MD ELECTRONICALLY SIGNED 06/03/2012 15:53

## 2014-04-27 NOTE — H&P (Signed)
PATIENT NAME:  Martin Jenkins, Martin Jenkins MR#:  [id number] DATE OF BIRTH:  April 29, 1960  DATE OF ADMISSION:  01/27/2012  ADDENDUM:   I did discuss the patient's case with Dr. Cherylann RatelLateef, Nephrology, and he felt that because of the patient's vancomycin level being 16, it is safe to restart vancomycin again. He does not feel that  his kidney function abnormality is vancomycin to blame.  He feels this could be severe dehydration as well as ACE inhibitor-related kidney injury versus additional infection. We are going to resume vancomycin. We are going to discontinue linezolid at this point; however, we will continue ceftazidime until we know for sure what kind of infection we are dealing with.    ____________________________ Katharina Caperima Linell Meldrum, MD rv:cb D: 01/27/2012 18:48:47 ET T: 01/27/2012 19:07:22 ET JOB#: [id number]  cc: Katharina Caperima Randon Somera, MD, <Dictator> Hiromi Knodel MD ELECTRONICALLY SIGNED 02/25/2012 17:12

## 2014-04-27 NOTE — Consult Note (Signed)
Brief Consult Note: Diagnosis: Mood disorder NOS.   Patient was seen by consultant.   Consult note dictated.   Recommend further assessment or treatment.   Orders entered.   Comments: Mr. Martin Jenkins has a h/o depression and mood instability. He is in care of unknown provider at his care home. He complains of mood swings, hyperactivity, easy crying. He worries that Zyprexa will cause weight gain. He complains of extremely poor slep.  He did not sleep last night with restoril. he remembers now that Ambien was effective but at 20 mg years ago. He is good spirits, interactive, flirtatious. There are no safety issues.  PLAN: 1. We will continue Tegretol for mood stabilization.   2. We lower zyprexa to 10 mg at night. Zyprexa is once a day medication. There appears to be no psychosis. It is possible that it has been used as a mood stabilizer. Geodon would be a better choice for this obese patient.   3. We offer Ambien for slep.  4. i will folow up.  Electronic Signatures: Kristine LineaPucilowska, Kapena Hamme (MD)  (Signed 14-Jan-14 21:21)  Authored: Brief Consult Note   Last Updated: 14-Jan-14 21:21 by Kristine LineaPucilowska, Melainie Krinsky (MD)

## 2014-04-27 NOTE — Consult Note (Signed)
PATIENT NAME:  Martin Jenkins, Martin Jenkins MR#:  [id number] DATE OF BIRTH:  1960/04/11  DATE OF CONSULTATION:  01/28/2012   REFERRING PHYSICIAN:  Srikar R. Sudini, MD CONSULTING PHYSICIAN:  Rosalyn Gess. Gloriajean Okun, MD  REASON FOR CONSULTATION:  Possible sepsis.   HISTORY OF PRESENT ILLNESS:  The patient is a 54 year old man with a past history significant for paraplegia, diabetes, status post right leg amputation, chronic osteomyelitis of the right pelvis, recent MRSA septic left hip with bony destruction status post drainage and IV antibiotics who was admitted with hypotension. The patient had been recently discharged approximately a week ago from the hospital for his MRSA left hip infection. At that time, the hip was drained and MRSA was recovered both from the hip and from the blood. The patient had been acutely ill in the CCU with acute renal failure during his last hospitalization, but his creatinine improved and he was able to get off pressors and was doing fairly well at the time of discharge. The patient states for the most part he has been doing well. He has had some cough and congestion, but no sputum production. He denies any fevers, chills or sweats. He was scheduled to come to my office on the day of admission for routine followup when he was noted to have a blood pressure in the 80s. He was brought to the Emergency Room and admitted to the CCU when his renal function was found to be worse again. At the time of admission, his creatinine was 5.84 and on January 15th when he was discharged, his creatinine was down to 1.69. The patient has received IV fluids, has not been on pressors and his blood pressure has dramatically improved. A rapid influenza was positive for influenza A. The patient received an influenza vaccine last week at the time of discharge. He is a nursing home resident. He denies any myalgias. He is currently on vancomycin, ceftazidime and Tamiflu. He is feeling well and is mentating well and  he denies any confusion with his low blood pressures previously.   ALLERGIES:  BENADRYL, MACROBID, PENICILLIN, SULFAMETHOXAZOLE.   PAST MEDICAL HISTORY:   1.  Paraplegia related to a fall in 1990. He has chronic Foley catheters since that time and has had multiple hospitalizations for sacral wounds.  2.  Admission in September 2013 at another hospital for chronic osteomyelitis of the right hip. Apparently, he received IV antibiotics at that time, but it is unclear how long he received them for.  3.  A recent admission this month for MRSA sepsis and left hip septic joint with bony destruction. The patient had acute renal failure at the time, but his creatinine improved. His mentation improved. He was able to get off pressors and was discharged on IV vancomycin which he was receiving through a PICC line.  4.  Diabetes.  5.  Status post colostomy.  6.  Right AKA, although a CT scan in the past has indicated he had a disarticulation of the right hip.   SOCIAL HISTORY:  The patient is a nursing home resident. He is married. He does not smoke.   FAMILY HISTORY:  Noncontributory.   REVIEW OF SYSTEMS:  GENERAL: He denies any fevers, chills or sweats, although the admission H and P indicates that he was having fevers and chills for the past week. He denies any malaise.  HEENT: No headaches. No sinus congestion. No sore throat.  NECK: No stiffness. No swollen glands.  RESPIRATORY: He has had some cough with nonproductive  sputum and some minimal shortness of breath.  CARDIAC: No chest pains or palpitations.  GASTROINTESTINAL: He denies any nausea, vomiting, abdominal pain or change in his bowels, although the H and P indicates that he was having some nausea and vomiting as well as constipation that has subsequently been relieved.  GENITOURINARY: He has a chronic indwelling catheter.  SKIN: He has chronic ulcer pressure ulcers and has a wound VAC on the left hip from his recent debridement.   MUSCULOSKELETAL: He is paraplegic, but has had no upper extremity complaints. He has had no myalgias or arthralgias.  NEUROLOGIC: He is paralyzed from the waist down. No new focal weakness.  PSYCHIATRIC: No complaints. He has been mentating well.   PHYSICAL EXAMINATION: VITAL SIGNS: T-max 98.7, T-current 97.7, pulse 92, blood pressure 108/54, 97% on room air.  GENERAL: A 54 year old white man in no acute distress awake and alert.  HEENT: Normocephalic, atraumatic. Pupils equal and reactive to light. Extraocular motion intact. Sclerae, conjunctivae, and lids are without evidence for emboli or petechiae. Oropharynx shows no erythema or exudate. Teeth and gums are in fair condition.  NECK: Supple. Full range of motion. Midline trachea. No lymphadenopathy. No thyromegaly.  LUNGS: Clear to auscultation bilaterally with good air movement. No focal consolidation.  CARDIAC: Regular rate and rhythm without murmur, rub or gallop.  ABDOMEN: Soft, nontender, and nondistended. No hepatosplenomegaly. No hernia is noted.  EXTREMITIES: He is status post right AKA. On the left, there is a wound VAC present over left hip surgical site. There is no surrounding erythema.  SKIN: No rashes. He has pressure ulcers that were not directly observed. There is no stigmata of endocarditis; specifically, no Janeway lesions or Osler nodes.  NEUROLOGIC: The patient was awake and interactive. He was moving his upper extremities. He is paraplegic.  PSYCHIATRIC: Mood and affect appeared normal.   DIAGNOSTIC DATA:  BUN 79, creatinine 5.84, bicarbonate 16, anion gap 13. LFTs from admission showed an AST 15, ALT 13, alkaline phosphatase 236, total bilirubin 0.5. White count is 6.9 with hemoglobin 8.7, platelet count 292, ANC 5.8. A rapid influenza test was positive for influenza A. Blood cultures are negative. Urinalysis had negative nitrites, 3+ leukocyte esterase, 17 red cells and 561 white cells. Urine culture is growing a  gram-negative rod. A 3-way of the abdomen demonstrates distended loops of small bowel with air and some fluid present. A CT scan of the abdomen and pelvis without contrast showed no evidence for obstruction. There were stable destructive changes of the left hip and an ulceration of the right ischium. There was evidence for right lower lobe pneumonia.   IMPRESSION:  A 54 year old male with a history of paraplegia, diabetes, status post right leg amputation, chronic osteomyelitis of the right pelvis, recent septic left hip with methicillin-resistant Staphylococcus aureus sepsis admitted with hypotension and influenza.   RECOMMENDATIONS:   1.  He has had some mild respiratory symptoms, but his influenza test is positive. He received the flu shot during his last hospitalization. It has not been long enough to have developed full immunity. I suspect he developed influenza related to his contact in the nursing home.  2.  His creatinine had been improving upon discharge last week, but is back up on readmission. I question whether this is related to the influenza, to dehydration or to hypotension and hypoperfusion. His blood pressure is better with fluids. He is not requiring pressors.  3.  I agree with Tamiflu for 5 days.  4.  Blood  cultures are pending. We will continue the ceftazidime until they return. There is no need to treat the gram-negative rod in the urine as he is paraplegic with a chronic Foley and is likely chronically colonized.  5.  We will continue vancomycin for a 6-week course per the last hospitalization plan.  6.  We will continue contact isolation given his recent MRSA infection and special droplet isolation for influenza as well.  7.  He missed his 1-week followup in the office due to his admission. We will check his CRP and sedimentation rate tomorrow.  8.  We will continue to follow the creatinine. This is a highly complex infectious disease case.   Thank you very much for involving me  in this patient's care.    ____________________________ Rosalyn Gess. Carlin Mamone, MD meb:si D: 01/28/2012 22:08:49 ET T: 01/28/2012 23:04:53 ET JOB#: [id number]  cc: Rosalyn Gess. Avary Eichenberger, MD, <Dictator> Azzan Butler E Jonhatan Hearty MD ELECTRONICALLY SIGNED 02/01/2012 15:27

## 2014-04-27 NOTE — Consult Note (Signed)
Patient seen, chart reviewed, films reviewed, note dictated.  Assessment: Bosniak class II left renal cyst,  Acute on chronic renal failure  Recommendation: Only slight to minimal calcification is noted at the rim of the 2.4 cm left renal cyst.  It is otherwise simple in nature.  Slight debris may be present on ultrasound that is layering at the base.  This is consistent with a Bosniak class II lesion.  This demonstrated stability since at least January of this year.  With 6 months stability, yearly monitoring is all that is indicated.  A renal ultrasound on a yearly basis is all that is indicated.  If there is any significant progression in size, increased thickening, increased septations, further intervention would then be warranted.  The ultrasound can be obtained by the primary physicians.  If there is any change over time, further evaluation from urology would then be indicated.  If there are any questions, please free to contact us.  Electronic Signatures: Smith Robertope, Blessing Zaucha S (MD)  (Signed on 30-May-14 08:09)  Authored  Last Updated: 30-May-14 08:09 by Smith Robertope, Aleana Fifita S (MD)

## 2014-04-27 NOTE — Consult Note (Signed)
PATIENT NAME:  Martin Jenkins, [name] MR#:  [id number] DATE OF BIRTH:  05-29-1960  DATE OF CONSULTATION:  06/03/2012  REFERRING PHYSICIAN:   CONSULTING PHYSICIAN:  Madolyn FriezeBrian S. Achilles Dunkope, MD  HISTORY: Mr. Martin Jenkins is a 54 year old white male, paraplegic due to fall approximately 20 years ago. He is wheelchair bound.  [facility] from his health care facility due to abnormal lab functions. His serum creatinine was found to be at 6.7 with potassium of 6.3. He had had nausea and poor appetite several days prior to presentation. His baseline serum creatinine was around 2.3. He has had gradual improvement in his serum creatinine since his hospitalization. A CT scan was obtained due to his renal function status demonstrating a 2.4 cm cyst in the upper pole portion of the left kidney. A small area of rim calcification was noted. He also had an ultrasound demonstrating slight layering of material at the cyst base. There are no significant septations or other findings that are of significant concern. This is consistent with a Bosniak class II lesion. It was also noted on CT imaging in January of this year. It has remained stable in size. It has demonstrated stability over the past 6 months. Monitoring is all that is indicated at present. There are no other areas of mass, stone, or evidence of obstruction.   PAST MEDICAL HISTORY: Significant for paraplegia secondary to fall, history of sacral decubitus ulcers, bipolar depression, diabetes mellitus, hypertension, peripheral vascular disease, chronic osteomyelitis and chronic indwelling Foley catheter.   PAST SURGICAL HISTORY: Significant for colostomy, above the knee amputation and surgical repair of ruptured bladder.   FAMILY HISTORY: Noncontributory.   SOCIAL HISTORY: Significant for previous tobacco abuse. No significant alcohol or drug use at this time. He does have a past history of significant alcohol abuse. He is currently residing in [facility].   ALLERGIES: BENADRYL, MACROBID, PENICILLIN AND SULFA.   PHYSICAL EXAMINATION:  VITAL SIGNS: Stable.  HEENT: Within normal limits.  CHEST: Clear to auscultation bilaterally with decreased breath sounds bilateral.  CARDIOVASCULAR: Regular rate and rhythm.  ABDOMEN: Soft, slightly protuberant, nontender, nondistended. No palpable masses.  EXTREMITIES: Free range of motion x 2.  NEUROLOGIC: Motor and sensory grossly intact in the upper extremities.   ASSESSMENT: Bosniak class II left renal neoplasm and acute on chronic renal failure.   RECOMMENDATION: Monitoring is all that is indicated at present. The Bosniak class II lesions has demonstrated stability over the past 6 months. Monitoring on a yearly basis is all that is indicated at present. This can be performed by his primary physician. If there is any significant change in size, wall thickening, calcification or development of septations, further evaluation from urology would then be recommended. If there are any further questions during his hospitalization, please free to contact us.   ____________________________ Madolyn FriezeBrian S. Achilles Dunkope, MD bsc:aw D: 06/03/2012 08:14:10 ET T: 06/03/2012 08:31:07 ET JOB#: [id number]  cc: Madolyn FriezeBrian S. Achilles Dunkope, MD, <Dictator> Madolyn FriezeBRIAN S Cipriano Millikan MD ELECTRONICALLY SIGNED 06/08/2012 13:34

## 2014-04-27 NOTE — Discharge Summary (Signed)
PATIENT NAME:  Aleen CampiFLECKENSTEIN, [place] MR#:  [id number] DATE OF BIRTH:  June 19, 1960  DATE OF ADMISSION:  01/27/2012 DATE OF DISCHARGE:  02/01/2012  REASON FOR ADMISSION: Hypotension.  FINAL DISCHARGE DIAGNOSES: 1.  Septic shock and hypovolemic shock, likely due to influenza as the patient had a positive flu.  2.  Positive for gram-positive cocci in blood which was a contamination.  3.  Methicillin-resistant Staphylococcus aureus bacteremia/contamination.  4.  Acute renal failure due to acute tubular necrosis secondary to hypotension.  5.  delirium and agitation.  6.  Left hip osteomyelitis with septic arthritis.  7.  Right lower lobe hospital-acquired pneumonia, which was most likely due to the flu. 8.  Acute on chronic anemia.   MEDICATIONS AT DISCHARGE:  Colace 100 mg twice daily, Ultram 50 mg as needed for pain, carbamazepine 200 mg twice daily, zolpidem 10 mg once daily, vancomycin 500 mg intravenously every 48 hours to be stopped on  02/23/2012, Protonix 40 mg once daily, heparin 500 units subcutaneously every 12 hours, vitamin B12 500 mcg 2 tabs once daily, multivitamins with minerals, TUMS 500 mg 3 times daily p.r.n., Tylenol p.r.n., DuoNebs every 6 hours p.r.n., ferrous sulfate 325 mg 3 times a day, Robitussin congestion p.r.n. cough, MiraLAX 17 grams once a day, senna plus 50/8.6 two times daily, Levemir 100 units subcutaneously in a mL injected 15 units subcutaneously once a day at bedtime, Buspirone 10 mg every 8 hours, alprazolam 0.5 mg 2 times a day, mirtazapine 30 mg once a day, vitamin C 500 mg twice daily, Zyprexa 10 mg once a day, hydrocortisone as a taper to start with 50 mg every 12 hours and then drop to 30 mg every 12 hours the following day and then the following day do 10 mg twice daily for 2 days and then stop. Also, he is on insulin sliding scale.    He is discharged to a nursing home facility. He was taking lisinopril 20 mg a day which has been put on hold for his acute kidney  injury and is due to be restarted whenever he sees Dr. Wynelle LinkKolluru at renal clinic in the next week.   HOSPITAL COURSE: The patient is a very nice 54 year old gentleman who was previously admitted with a history of septic arthritis.  He was discharged to a nursing home with a wound VAC and treatment with vancomycin as he grew out MRSA on the aspirate from the joint. He was recently discharged in 01/20/2012 and at that point he had MRSA bacteremia.  That was due to his hip infection.  Apparently the patient was found to be hypotensive, although he was mostly asymptomatic. The staff noticed that his blood pressure was in the 80s systolics and he was instructed to be brought to the ER. In the ER his blood pressure was 90/50 by the EMS. He received some boluses and his blood pressure bumped up to 110/80.  He had a temperature of 99.1 and he was found to have a creatinine of 5.84 on admission. The patient was admitted with diagnosis of sepsis.   PROBLEM LIST: 1.  Sepsis.  The patient was admitted to the medical floor. He was started on linezolid and cefazolin to cover for MRSA and Pseudomonas. He has vancomycin but that was felt due to his acute kidney failure. IV fluids were given. He did not need pressors at this point but his blood pressure started to improve. He got a transfusion of blood loss since his hemoglobin was decreased to 7.3 and  he was so hypotensive.  2.  The patient was found to have and gram-positive cocci on some of his blood cultures on the 22nd but it was found to be coagulase-negative Staphylococcus for what it was called as a contamination. The patient had a flu test that was positive and a CT abdomen that showed right lower lobe pneumonia, which was most likely due to the flu. The patient was seen by Dr. Leavy Cella, who determined that the gram-positive cocci was due to a contamination and the urine infection E. coli was already treated with the amount of antibiotics that he got.  Since he had the  positive influenza, he was started on Tamiflu.  His antibiotics were changed to vancomycin but then it was determined that the E. coli was a chronically colonization for what cefazolin was  stopped.  3.  The patient has several episodes of delirium where he was really altered and aggressive and after he was put back on his medications, he started to improve.  4.  The patient had some improvement of his kidney function; by discharge his creatinine was 2.43 from an initial creatinine of 5.8 and he is to follow with Dr. Wynelle Link as an outpatient.  5.  He ss going to be discharged on a wound VAC.   He also needs to see Dr. Rosita Kea for follow-up on his osteomyelitis and septic joint.   At this moment, he is discharged in good condition.  His other medical problems remain stable during this hospitalization. His blood sugars were in the 160s to close to 200.  He is on insulin sliding scale but I want to continue in the nursing home and his Levemir at night.   Time spent about 40 minutes discharging this patient.    ____________________________ Felipa Furnace, MD rsg:ct D: 02/02/2012 09:12:45 ET T: 02/02/2012 09:39:48 ET JOB#: [id number]  cc: Felipa Furnace, MD, <Dictator> Laverda Sorenson, MD Serita Sheller Maryellen Pile, MD Leitha Schuller, MD  Regan Rakers Juanda Chance MD ELECTRONICALLY SIGNED 02/04/2012 18:57

## 2014-04-27 NOTE — H&P (Signed)
PATIENT NAME:  Martin Jenkins, Martin Jenkins DATE OF BIRTH:  July 20, 1960  DATE OF ADMISSION:  06/22/2012  PRIMARY CARE PHYSICIAN: Nicky Pugh, MD    CHIEF COMPLAINT: Abnormal labs. "I am not feeling well."   HISTORY OF PRESENT ILLNESS:  Martin Jenkins is a 54 year old Caucasian gentleman with history of paraplegia secondary to a fall 20 years ago who is wheelchair bound, has a chronic Foley and chronic decubitus ulcers, here from [facility] with elevated BUN and creatinine. His BUN is around 9.4 and potassium of 6.0. The patient was seen in the Emergency Room, complains of not feeling well. He tells me he would like to go ahead and get dialysis started, which he made a decision recently, and he also reports he wants to give himself a chance and see if it helps him. ER physician has notified Dr. Candiss Norse, nephrology. The patient is going to be admitted for further evaluation and management.   The patient was also noted to have very dark and thick foul-smelling urine.  He has elevated white count of 19,000.  He has tachycardia, likely has SIRS due to UTI. The patient has history of MRSA in January 2014. We will give him Rocephin and vancomycin.   PAST MEDICAL HISTORY: 1.  Paraplegia after he fell from a 3rd-floor window 20 years ago, fractured his spine and since then has been wheelchair bound and bed bound.  2.  Chronic sacral decubitus.  3.  Bipolar disorder.  4.  Depression, anxiety.  5.  Type 2 diabetes.  6.  Hypertension.  7.  History of labile mood disorder.  8.  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  9.  Chronic osteomyelitis with chronic sacral ulcers.  10.  Chronic indwelling Foley catheter with UTIs in the past.  11.  Morbid obesity.   PAST SURGICAL HISTORY: 1.  Colostomy.  2.  Above-knee amputation due to peripheral vascular disease.  3.  Surgical repair of ruptured bladder.   FAMILY HISTORY: No history of  hypertension or diabetes.   SOCIAL HISTORY: Previous tobacco abuse. No alcohol use.  He was an alcoholic before. He currently resides at [facility].   REVIEW OF SYSTEMS:  CONSTITUTIONAL: No fever. Positive for fatigue, weakness.  EYES: No blurred or double vision.  ENT: No tinnitus, ear pain, hearing loss or postnasal drip.  RESPIRATORY: No cough, wheeze, hemoptysis, dyspnea.  CARDIOVASCULAR: No orthopnea, chest pain, edema or hypertension.  GASTROINTESTINAL: No nausea, vomiting, diarrhea, abdominal pain.  GENITOURINARY:  No renal calculus, frequency.  ENDOCRINE: No polyuria, nocturia or thyroid problems.  HEMATOLOGY: Positive for chronic anemia.  SKIN: No acne, rash.  Positive for chronic decubitus ulcers.  MUSCULOSKELETAL: Positive for back pain and chronic pain.  NEUROLOGICAL: No CVA or TIA. Positive for paraplegia, and the patient is bed bound and wheelchair bound.   PSYCHIATRIC: Positive for anxiety and mood disorder.  All other systems reviewed and negative.   PHYSICAL EXAMINATION: GENERAL: The patient is awake, he is alert, he is oriented x 3, not in acute distress, morbidly obese.  VITAL SIGNS: Afebrile, pulse is 116, blood pressure is 107/59, sats are 99% on room air.  HEENT: Atraumatic, normocephalic. PERRLA. EOM intact. Oral mucosa is moist.  NECK: Supple. No JVD. No carotid bruit.  RESPIRATORY: Clear to auscultation bilaterally. No rales, rhonchi, respiratory distress or labored breathing.  CARDIOVASCULAR: Tachycardia present. No murmur heard. PMI not lateralized. Chest is nontender.  EXTREMITIES: Good pedal pulses,  good femoral pulses. No lower extremity edema.   Left lower extremity, there is pedal edema present. Pulses are feeble in both femoral and pedal pulses.   ABDOMEN: Obese, soft, nontender. No organomegaly. Positive bowel sounds.   Abdominal skin appears normal. No abdominal tenderness. SKIN:  Examination of the buttocks could not be done because  of the patient's body habitus, but the patient does have chronic sacral decubitus.  We will examine later with the help of nursing staff.   NEUROLOGIC: The patient has paraplegia. He has right above-knee amputation as well.  PSYCHIATRIC: The patient is awake, alert, oriented x 3. Appears to be having irritable mood today.   LABORATORY AND RADIOLOGICAL DATA:  EKG shows sinus tachycardia. Sodium is 131, potassium is 6.0, alk phos is 144.  White count is 19.4, H and H is 7.5 and 23.9, platelet count is 519, MCV is 89.   ASSESSMENT: Martin Jenkins is a 54 year old with multiple medical problems, history of chronic paraplegia, chronic indwelling Foley catheter, hypertension, diabetes and CKD, stage V, comes in with acute renal failure and worsening renal failure and hyperkalemia. He is being admitted with:   1.  Acute on chronic kidney disease, stage V, now appears to be end-stage renal disease with severe hyperkalemia. The patient says he wants to consider starting hemodialysis. Dr. Candiss Norse is notified. The patient will need HD access for hemodialysis. Creatinine is more than 9.4. We will start him on some IV bicarbonate drip per Dr. Keturah Barre recommendation, follow labs closely.  2.  Severe hyperkalemia:  Received insulin plus D50 and calcium gluconate in the Emergency Room.  The patient has been refusing to take Kayexalate. We will monitor levels closely. Hopefully, IV bicarbonate drip will bring the potassium down.  2.  Systemic inflammatory response syndrome with tachycardia and leukocytosis:  Suspected from UTI in the setting of indwelling Foley catheter. The patient also has chronic sacral decubitus, stage IV, along with history of osteomyelitis as well. The patient also has history of MRSA.  We will cover with vanc and Rocephin for now, do urine culture, blood cultures and follow-up white cell count levels.  3.  Chronic paraplegia:  The patient is bed bound, has chronic Foley. He also has right  above-knee amputation secondary to severe PVD.  4.  Chronic stage IV decubitus ulcers:  Wound consult.  5.  Type 2 diabetes:  Keep the patient on sliding scale insulin. Sugars are in the 90s.  6.  Anxiety and chronic pain:  We will continue his home meds, which are Xanax and BuSpar.  7.  Severe protein calorie malnutrition:  His albumin is 1.7. He is on Megace. We will continue that and consider a dietitian consult.  8.  Anemia of chronic disease:  We will continue p.o. iron, consider Procrit and transfusion as needed.   4.  Deep vein thrombosis prophylaxis:  Heparin subcutaneous t.i.d.  5.  Further workup according to the patient's clinical course.   CODE STATUS:  No family members present in the Emergency Room.  The  patient is a DNR, NO CODE.  This was discussed with the patient, who is agreeable to it.   CRITICAL TIME SPENT: 60 minutes.  ____________________________ Martin Rochester Posey Pronto, MD sap:cb D: 06/22/2012 14:35:00 ET T: 06/22/2012 14:56:54 ET JOB#: [id number]  cc: Jaquelyn Bitter B. Brynda Greathouse, MD Saida Lonon A. Posey Pronto, MD, <Dictator> Ilda Basset MD ELECTRONICALLY SIGNED 06/29/2012 14:27

## 2014-04-27 NOTE — Discharge Summary (Signed)
PATIENT NAME:  Martin Jenkins, Martin Jenkins MR#:  [id number] DATE OF BIRTH:  01/27/1960  DATE OF ADMISSION:  05/20/2012 DATE OF DISCHARGE:    Please note, this interim summary covers from May 23rd up until May 29th. Please refer to Dr. Mathews RobinsonsWieting's interim discharge summary for further details regarding previous hospitalization course.   PRIMARY CARE PROVIDER: Dr. Maryellen PileEason.   DISCHARGE DIAGNOSES:  1. Acute renal failure on chronic kidney disease, felt to be due to acute tubular necrosis.  2. Severe deconditioning and poor p.o. intake. Has been started on Megace. Seen by dietary.  3. Anemia of chronic disease with worsening hemoglobin, status post 1 unit of packed red blood cells. On iron and Procrit.  4. Systemic inflammatory response syndrome (SIRS) present on admission, currently not on any antibiotics.  5. Chronic paraplegia.  6. Diabetes.  7. Anxiety, chronic pain.  8. Stage II decubitus ulcer, currently receiving wound care.  9. Severe caloric protein malnutrition.   CONSULTANTS DURING THIS TIME PERIOD: Dr. Thedore MinsSingh and Dr. Mady HaagensenMunsoor Lateef of nephrology.   HOSPITAL COURSE: Please refer to H and P done by the admitting physician. The patient is a 54 year old with history of paraplegia secondary to fall 20 years ago who is wheelchair bound, brought to the ED with abnormal blood work including a creatinine of 6.7, potassium of 6.3. The patient had similar presentation like this with acute tubular necrosis in the past, so he was readmitted for his acute renal failure and he was continued on IV fluid replacement. The patient is continuing to be on IV fluids. His creatinine has slowly improved. It is currently 4.08. I have been in touch with nephrology during this whole hospital course regarding plan of care. They want him to continue to have the IV fluids and monitor his renal function until his creatinine is less than 3. During this timeframe, also his hemoglobin did drop, so he was transfused 1 unit of packed  RBCs. The patient also has had issues with poor p.o. intake, so he was started on Megace to help assist with his appetite.   MEDICATIONS AT THE TIME OF INTERIM: Tylenol 650 mg q.4 p.r.n., alprazolam 0.5 b.i.d. p.r.n., buspirone 10 one tab p.o. q.8, carbamazepine 200 b.i.d., sliding scale insulin, mirtazapine 30 at bedtime, Zyprexa 10 at bedtime, Zofran 4 mg IV q.4 p.r.n., tramadol 50 q.6 p.r.n., iron sulfate b.i.d., Epogen 2000 units subcutaneous weekly, KCl 20 mEq p.o. b.i.d., Ambien 5 at bedtime p.r.n. sleep, Reglan 5 mg t.i.d., sodium citrate 30 mL b.i.d., Megace 400 mg daily.    TIME SPENT: 35 minutes.   ____________________________ Lacie ScottsShreyang H. Allena KatzPatel, MD shp:gb D: 06/02/2012 14:27:13 ET T: 06/02/2012 23:21:45 ET JOB#: [id number]  cc: Kolbi Tofte H. Allena KatzPatel, MD, <Dictator> Charise CarwinSHREYANG H Laylanie Kruczek MD ELECTRONICALLY SIGNED 06/11/2012 8:20

## 2014-04-27 NOTE — Consult Note (Signed)
PATIENT NAME:  Martin Jenkins, Draken MR#:  [id number] DATE OF BIRTH:  21-Apr-1960  DATE OF CONSULTATION:  01/11/2012  REFERRING PHYSICIAN:  Ramonita LabAruna Gouru, MD and Lestine MountMichael Abern, MD  CONSULTING PHYSICIAN:  Suszanne ConnersMichael R. Evelene CroonWolff, MD  REASON FOR CONSULTATION: Hydronephrosis.   HISTORY OF PRESENT ILLNESS: The patient is a 54 year old Caucasian male paraplegic admitted to the hospital with altered mental status due to sepsis. He has an infected decubitus wound and possible urinary tract infection. He has been paraplegic since 1990, and his bladder and neurogenic bladder have been managed with indwelling Foley catheters with monthly exchanges. He had a renal ultrasound performed January 5th which indicated minimal right hydronephrosis. He also had a CT scan done on January 5th which revealed mild bilateral hydronephrosis. According to nursing personnel, the patient's catheter was not draining when he was initially admitted; but when they changed his catheter, the catheter has been draining copious amounts of urine, and actually his renal function has improved.    ALLERGIES: BENADRYL, MACROBID, PENICILLIN AND SULFA.   CURRENT MEDICATIONS: Home medication list was not available.    PAST SURGICAL HISTORY: Colostomy, decubitus debridements, and repair of a ruptured bladder in 1990. He has also had a right above the knee amputation.   SOCIAL HISTORY: The patient is a resident at [facility].   FAMILY HISTORY: Noncontributory.   PHYSICAL EXAMINATION: Deferred.   LABORATORY, DIAGNOSTIC AND RADIOLOGICAL DATA:  Renal ultrasound and abdominopelvic CT scan results were reviewed.  BUN was 102 and creatinine 6.08 on January 6th.   IMPRESSION: 1. Neurogenic bladder secondary to paraplegia, currently being managed with indwelling Foley.  2. Minimal right hydronephrosis with questionable bilateral mild hydronephrosis.  SUGGESTIONS: 1. Urologic intervention is not indicated at this time.  2. It appears  that changing the Foley has resolved the initial problem with decreased drainage from the catheter, and renal function appears to be improving.  3. Continue monthly catheter exchanges.   ____________________________ Suszanne ConnersMichael R. Evelene CroonWolff, MD mrw:cb D: 01/11/2012 10:39:10 ET T: 01/11/2012 10:55:17 ET JOB#: [id number]  cc: Suszanne ConnersMichael R. Evelene CroonWolff, MD, <Dictator> Orson ApeMICHAEL R WOLFF MD ELECTRONICALLY SIGNED 01/11/2012 19:03

## 2014-04-27 NOTE — H&P (Signed)
PATIENT NAME:  Martin Jenkins, MICCICHE MR#:  [id number] DATE OF BIRTH:  1961-01-04  DATE OF ADMISSION:  05/20/2012  PRIMARY CARE PHYSICIAN: Dr. Maryellen Pile.  EMERGENCY ROOM PHYSICIAN: Dr. Si Raider.   REASON FOR ADMISSION:  Abnormal labs.   HISTORY OF PRESENT ILLNESS: A 54 year old male with history of paraplegia secondary to a fall 20 years ago, who is wheelchair bound, brought in because of his labs being abnormal. The patient is from [facility], and his BUN  81,  creatinine 6.7 and potassium 6.3. The patient seen at bedside complains of nausea and poor appetite for the past 3 to 4 days. No trouble breathing, no chest pain. Diarrhea is noted from the colostomy bag. Denies any fever. The patient's  BUN and creatinine in January was BUN 15 and creatinine 2.43. The patient's BUN and creatinine on May 12th showed a BUN of 77, creatinine 5.76. The patient's potassium was 5.2 at that time. The patient's Foley was flushed according to note. His BUN and creatinine on March 3rd was BUN 9 and creatinine 0.70, so this is acute renal failure.   PAST MEDICAL HISTORY:  Significant for history of paraplegia after he fell from 3rd floor window 20 years ago, fractured his spine, and since then he has been in a wheelchair and bedbound, has sacral decubitus, history of bipolar depression and history of diabetes mellitus type 2, hypertension, labile moods, and the patient also has a history of above-knee amputation because of peripheral vascular disease. Other diagnoses include chronic sacral ulcers with chronic osteomyelitis, chronic indwelling Foley catheter, left hip mass with possible septic arthritis.   PAST SURGICAL HISTORY: Significant for colostomy, above-knee amputation, surgical repair of ruptured bladder.   FAMILY HISTORY: No hypertension or diabetes.   SOCIAL HISTORY: Previous tobacco abuse. No alcohol. No drugs at this time. He was an alcoholic before. He is right now living at [facility].   The patient also had a wound VAC placed when he will admitted in January. At that time, he also had left hip osteomyelitis, septic shock.   ALLERGIES: BENADRYL, MACROBID, PENICILLIN, SULFAMETHOXAZOLE.   CODE STATUS:  DNR.  FAMILY HISTORY: No hypertension or diabetes.   REVIEW OF SYSTEMS:   CONSTITUTIONAL: Poor appetite, weak.  EYES: No blurred vision.  EARS, NOSE, THROAT: No tinnitus. No epistaxis. No difficulty swallowing.  RESPIRATORY: No cough. No wheezing. No trouble breathing.  CARDIOVASCULAR: No chest pain. No palpitations.  GASTROINTESTINAL: The patient does have some nausea and feeling like he has not eaten for the past 2 to 3 days.  GENITOURINARY:  He  had no dysuria. The patient has a Foley catheter.  ENDOCRINE:  Has diabetes.  HEMATOLOGIC:  The patient has anemia.  MUSCULOSKELETAL: The patient has above-knee amputation on the right and paraplegic.  NEUROLOGIC: The patient is paraplegic for a long time due to an  accident and paraplegic because of the fall history. No history of migraines.  PSYCHIATRIC:  The patient does have bipolar disorder, mood lability.   PHYSICAL EXAMINATION:  VITAL SIGNS:  Temperature 98.5, heart rate 93, respirations 20, blood pressure 137/67, sats 98% on room air.  GENERAL: He is alert, awake, oriented, obese male, not in distress. Paraplegic. Answering questions appropriately. Slightly irritable. HEAD AND EYES: Head atraumatic, normocephalic. Pupils equally reacting to light. Extraocular movements intact.  EARS, NOSE, THROAT:  No tympanic membrane congestion. No turbinate hypertrophy. No oropharyngeal erythema.  NECK:  Supple, symmetric. No masses. Thyroid midline, not enlarged. No JVD.  RESPIRATORY:  Good respiratory effort.  Clear to auscultation. The patient not using accessory muscles of inspiration.  CARDIOVASCULAR: S1, S2 regular. No murmurs. No gallops. PMI not displaced.  The patient's pulses are equal in carotids.    GASTROINTESTINAL:  The patient has no abdominal tenderness. Colostomy bay filled with soft stool. Bowel sounds present. No hernias.  MUSCULOSKELETAL:  Paraplegic, both legs and right above-knee amputation present.   SKIN:  Within normal limits. GENITOURINARY: Has indwelling Foley catheter and some clear urine is observed.  MUSCULOSKELETAL: Upper extremities power is good. Lower extremities: Paraplegic.  LYMPH NODES: No lymphadenopathy in cervical region.  NEUROLOGIC: Alert, awake, oriented. Cranial nerves intact. The patient is paraplegic. No sensations and no reflexes in the legs.  PSYCHIATRIC:  Somewhat irritable.  Awake, alert, oriented.    LABORATORY AND RADIOLOGICAL DATA: The patient's urine is cloudy and UA is showing 741 of WBC , 2+ bacteria.  Electrolytes: Sodium 136, potassium 6.3, chloride 104, bicarb 22, BUN 81, creatinine 6.72, glucose 77.   WBC is 12.6, hemoglobin 8, hematocrit 24.8, platelets 458. Magnesium is 3.2.   EKG: Showed a normal sinus rhythm with 93 beats per minute. No ST-T changes.   ASSESSMENT AND PLAN: The patient is a 54 year old male with acute renal failure and severe hyperkalemia, likely secondary to dehydration, but also need to rule out obstructive uropathy. Called Dr. Wynelle LinkKolluru, who is the nephrologist on call.  Does not think he needs dialysis at this time. He already got potassium shifting measures with Kayexalate, dextrose, insulin and also some calcium gluconate and bicarb. The patient has no EKG changes, so we are going to keep him on telemetry, and recheck the potassium again around 10:00 p.m. and correct as necessary.  For acute renal failure, the patient is going to be started on IV fluids. Follow the renal sonogram, follow BMP closely, and follow nephrology recommendations. The patient has leukocytosis, has sacral decubitus, but he is getting wound care with calcium alginate; continue that. Wounds were checked at [facility] on May 14.  Right now, we  will hold off on antibiotics for urinary tract infection because the patient has a history of multidrug-resistant  urinary tract infection, so we will follow the urine cultures and start the appropriate antibiotic if needed.    OTHER DIAGNOSES: Include: 1.  History of bipolar disorder and anxiety. Continue home medications.  2.  History of chronic anemia, watch closely, no evidence of bleeding, and we will hold off on the transfusion at this time.  3.  Diabetes. The patient is on insulin aspart with sliding scale. Continue that.  Also, we will resume Levemir once his p.o. intake is good.  4.  History of left hip osteomyelitis and septic arthritis. The patient was discharged with vanc during the  last admission in January and he did receive vanc until February 18th.  5.  Regarding his sacral decubitus,  continue the wound care.   Time Spent on history and physical reviewing all the charts took more than 70 minutes.    ____________________________ Katha HammingSnehalatha Naelle Diegel, MD sk:dmm D: 05/20/2012 21:14:23 ET T: 05/20/2012 21:43:08 ET JOB#: [id number]  cc: Katha HammingSnehalatha Gage Weant, MD, <Dictator> Katha HammingSNEHALATHA Cuauhtemoc Huegel MD ELECTRONICALLY SIGNED 07/18/2012 18:56

## 2014-04-27 NOTE — Discharge Summary (Signed)
PATIENT NAME:  Martin Jenkins, Martin Jenkins MR#:  [id number] DATE OF BIRTH:  20-Apr-1960  DATE OF ADMISSION:  01/10/2012 DATE OF DISCHARGE:  01/20/2012  Addendum. This is a continuation.  Please see discharge summary dictated on 01/15/2012 by Dr. Milagros Loll but it gives you most of the information for this hospitalization.   I am dictating a discharge summary for the following dates: January 11, 12, 13, 14 and 15.  Patient is discharged in good condition to a nursing home. He is on IV vancomycin. He needs to continue to take until 02/23/2012.   DISCHARGE MEDICATIONS:  The patient is discharged in good condition with the following medications: Vitamin with minerals once daily, vitamin C 500 mg twice daily, lisinopril 5 mg once daily, cyanocobalamin 500 mcg 2 tablets once a day, buspirone 10 mg every 8 hours, MiraLAX powder 17 grams once a day, mirtazapine 30 mg at bedtime, Tylenol p.r.n. pain or fever, ferrous sulfate 325 mg 3 times daily, Colace 100 mg twice daily, Ultram 50 mg every 6 hours as needed for pain, insulin detemir 15 units once a day, alprazolam 0.5 mg as needed twice daily for anxiety, insulin aspart protamine use sliding scale, prednisone 10 mg once a day only for 1 day then stop,  calcium carbonate 500 mg 3 times daily, carbamazepine 200 mg twice daily, olanzapine or Zyprexa 10 mg once a day, zolpidem 10 mg once a day, vancomycin 500 mg every 48 hours until 02/23/2012, Protonix 40 mg once a day, heparin subcutaneously every 12 hours.   STOP THE FOLLOWING MEDICATIONS:  Seroquel 200 mg, Coreg 3.125 and trazodone 25 mg. The reason to stop these medications was mostly due to hypotension. Those medications were changed.  The Coreg was just substituted for lisinopril and his blood pressure is stable. The Seroquel has been substituted for olanzapine carbamazepine and he has good results with these medications and the trazodone has been stopped due to the fact that the is sleeping better on the  olanzapine.   The patient is to follow up with Dr. Kennedy Bucker in the next 1 to 2 weeks, Dr. Wallene Huh and Dr. Selena Batten. The patient is discharged on a wound VAC.   FINAL DIAGNOSES: 1. Methicillin-resistant Staphylococcus aureus bacteremia from the hip. Blood cultures positive on January 6, negative on January 8, and so far negative. Infectious disease was on board, Dr. Casimiro Needle Blocker. Follow up with Dr. Orson Aloe.  2.  Septic shock due to left hip osteomyelitis and abscess. The patient treated with vancomycin.  3.  Acute kidney injury due to septic shock and obstruction. The patient was on hemodialysis continued renal replacement therapy, now resolved.  3.  Acute encephalopathy due to bacteremia, now resolved.  4.  Metabolic acidosis due to acute renal failure and sepsis.  5.  Mild to moderate hydronephrosis with hydroureter, which resolved. Patient with indwelling catheter now chronically.  6.  Large decubitus ulcers.  7.  Diabetes.  8.  Paraplegia.  9.  Acute and chronic anemia.  10.  Hyponatremia.   The patient stay from the January 10 to the January 15 in the hospital after discharge summary was dictated by Dr. Elpidio Anis, due to unable to put him on back in the nursing home awaiting for a [facility] evaluation. At this moment, he is okay to be discharged     ____________________________ Felipa Furnace, MD rsg:cc D: 01/20/2012 16:08:00 ET T: 01/20/2012 19:40:15 ET JOB#: [id number]  cc: Felipa Furnace, MD, <Dictator> Maddie Brazier Juanda Chance MD ELECTRONICALLY  SIGNED 01/29/2012 10:59

## 2014-04-27 NOTE — Consult Note (Signed)
Impression: 54yo male w/ h/o paraplegia, DM, s/p right leg amputation, chronic osteomyelitis of the right pelvis admitted with Staph aureus sepsis and destructive mass in the left hip and acute renal failure.  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  Unclear how long he was treated and when the antibiotics were stopped.  His recent CT showed evidence for a destructive mass on the left.  This is likely infection.  He has no pain due to his paraplegia.   Surgery is to take him to the OR tomorrow for debridement. Agree with broad antibiotics therapy.  The initialy therapy of vanco, aztreonam and flagyl would have covered anaerobes perfectly well.  Meropenem and vanco are also acceptable.  There is an increased risk of seizure using carbapenems in renal failure.  Will hopefully be able to narrow the spectrum after surgical cultures are available. His Cr is improving.  Remains on pressors. Would put him on contact isolation given his recent Methacillin Resistant Staph aureus infection and current Staph bacteremia. Will need TTE when stable.  As he has a likely source, will not need to get TEE unless his BCx remain persistently positive. Repeat BCx in 2 days.  Electronic Signatures: Gracelyn Coventry, Rosalyn GessMichael E (MD)  (Signed on 06-Jan-14 14:37)  Authored  Last Updated: 06-Jan-14 14:37 by Orphia Mctigue, Rosalyn GessMichael E (MD)

## 2014-04-27 NOTE — H&P (Signed)
PATIENT NAME:  Martin Jenkins, Martin Jenkins MR#:  [id number] DATE OF BIRTH:  02-16-1960  DATE OF ADMISSION:  01/27/2012  PRIMARY CARE PHYSICIAN: Alden ServerErnest B. Maryellen PileEason, MD  HISTORY OF PRESENT ILLNESS: The patient is a 54 year old Caucasian male with past medical history significant for history of recent admission to the hospital in January 2014, discharged on 01/20/2012, for MRSA bacteremia which was felt to be due to left hip osteomyelitis, presents back to the hospital with complaints of hypotension. Apparently the patient was sent from facility today with hypotension. He was supposed to see Dr. Leavy CellaBlocker and staff noted that his blood pressure was in 80s. Dr. Maryellen PileEason instructed staff to bring patient to the Emergency Room for further evaluation due to recent hospitalization in [place]. Systolic blood pressure was noted to be 90/50 by EMS. He was bolused and his blood pressure somewhat improved to 110/80. The patient has left arm PICC line. His temperature was noticed to be 99.1 axillary. He was brought to the Emergency Room. His urine was noted to be very thick and cloudy and scant. His lab data revealed significant kidney impairment with creatinine of 5.84,  creatinine being 1.69 just a week ago on the 01/20/2012, and hospitalist services were contacted for admission.   PAST MEDICAL HISTORY: Significant for history of MRSA bacteremia (discharged on 01/20/2012), history of septic shock due to left hip osteomyelitis, status post VAC wound placement and debridement, history of acute renal failure (improving to 1.69 on the day of discharge), history of encephalopathy, metabolic acidosis, chronic decubitus ulcers, diabetes mellitus type 2, anemia, hyponatremia, also history of paraplegia status post accident in 1990s, chronic indwelling catheter, multiple hospitalizations for sacral wounds in the past 8 years, colostomy bag in September 2013, stage IV right ischia as well as sacral decubitus ulcer and chronic osteomyelitis in  September 2013. At that time, he had debridement done. He also had a urinary tract infection with Klebsiella, E. coli, was treated with antibiotic therapy. He had wound cultures which were MRSA on previous evaluation and was on vancomycin. Past medical history is significant for paraplegia due to cervical injury, uncontrolled diabetes mellitus, sepsis in the past, chronic indwelling catheter as mentioned above.  PAST SURGICAL HISTORY: Colostomy, repair of ruptured bladder as well as right above-knee amputation.   ALLERGIES: BENADRYL, MACROBID, PENICILLIN, SULFAMETHOXAZOLE AS WELL AS TRIMETHOPRIM.   HOME MEDICATIONS: According to medical records, the patient is on acetaminophen 325 mg every 6 hours as needed, alprazolam 0.5 mg twice a day as needed, buspirone 10 mg p.o. every 8 hours, carbamazepine 200 mg p.o. twice a day, Colace 100 mg p.o. twice daily, cyanocobalamin 1000 mcg p.o. daily, DuoNebs 0.5/2.5 in 3 mL solution every 6 hours, iron sulfate 325 mg p.o. 3 times daily, heparin 5000 units twice a day, Levemir 15 units at bedtime, lisinopril 5 mg p.o. daily, MiraLax 17 grams once a day, Remeron 30 mg p.o. daily, multivitamins once daily, NovoLog sliding scale, pantoprazole 40 mg p.o. daily, prednisone 10 mg p.o. once (not really sure exactly, he probably received this on 01/20/2012 then was discontinued), Robitussin 10 cubic centimeters every 6 hours as needed, Senna Plus 50/8.6 mg 1 tablet twice a day, TUMS 500 mg p.o. 3 times daily, Ultram 50 mg p.o. every 6 hours as needed and vancomycin 500 mg IV every 48 hours (the patient is to get this antibiotic through 02/23/2012).   SOCIAL HISTORY: [facility]. Bedbound due to paraplegia. The patient's power of attorney is his wife  and the patient is DO NOT RESUSCITATE.   FAMILY HISTORY: Not obtainable at this time due to patient being poorly cooperative.   REVIEW OF SYSTEMS: Positive for fever and chills for the past  1 week, pains in the shoulders as well as neck, some vision changes since he has been initiated on breathing treatments, some cough in skilled nursing facility a week ago, not able to get any phlegm, some sore throat on Friday (5 days ago), intermittent nausea and vomiting as well as severe constipation which was just relieved, also chronic indwelling catheter. Denies any fatigue, weakness, weight loss or gain.  EYES: Denies any blurry vision, double vision, glaucoma or cataracts except as noted above.  ENT: Denies any tinnitus, allergies, epistaxis, sinus pain, dentures or difficulty swallowing. RESPIRATORY: Denies any wheezes, asthma, COPD.  CARDIOVASCULAR: Denies any chest pains, orthopnea, arrhythmias, palpitations or syncope. GASTROINTESTINAL: Denies any abdominal pains, rectal bleeding, change in bowel habits.  GENITOURINARY: Denies any dysuria, hematuria, frequency, incontinence. Has indwelling catheter. SKIN: Denies acne, rashes, lesions or change in moles.  MUSCULOSKELETAL: Admits of cramps in his lower extremities.  NEUROLOGICAL: No numbness, epilepsy or tremors. PSYCHIATRIC: Denies anxiety, insomnia or depression.   PHYSICAL EXAMINATION:  VITAL SIGNS: On arrival to the hospital, the patient's temperature is 99.3, pulse 103, respiratory rate 20, blood pressure 66/36, oxygen saturation 95% on room air.     GENERAL: This is a well-developed, well-nourished, obese Caucasian male, intermittently coughing and rattling in his chest.  HEENT: His pupils are equal and reactive to light. Extraocular movements intact. No icterus or conjunctivitis. Has normal hearing. No pharyngeal erythema. Mucosa is moist.  NECK: No masses. Supple, nontender. Thyroid is not enlarged. No adenopathy. No JVD or carotid bruits bilaterally. Full range of motion.  LUNGS: Markedly diminished breath sounds. Otherwise, crackles and rales were noted  bilaterally in both lung fields, a few rhonchi as well as somewhat labored  inspirations as well as increased effort to breathe, in mild respiratory distress.  CARDIOVASCULAR: S1, S2 appreciated. No murmurs, gallops or rubs noted. Obscured by adventitial sounds from his respiratory rales. PMI not lateralized. Chest is nontender to palpation.  EXTREMITIES: 1+ pedal pulses in left lower extremity. No calf tenderness or cyanosis was noted.  ABDOMEN: Protuberant, soft. No hepatosplenomegaly or masses were noted. Bowel sounds were present but diminished.  RECTAL: The patient does have colostomy bag in the left lower quadrant which was empty.  GENITOURINARY: The patient does have indwelling catheter which shows milky yellowish-looking urine, little amount, approximately 50 to 100 mL. MUSCULOSKELETAL: Muscle strength: The patient is not able to move his extremities, except his upper extremities some; however, he cannot move his lower extremities. He has intermittent spasms of his left lower extremity. He does not have right lower extremity (amputated in 2005). Not able to assess for kyphosis.  SKIN: Not able to assess. I cannot turn him around and he cannot sit up. No rashes otherwise anteriorly. No lesions, no erythema, nodularity or induration. It was warm and dry to palpation.  LYMPH: No adenopathy in the cervical region.  NEUROLOGICAL: Cranial nerves grossly intact. Sensory diminished or absent in lower extremities. No dysarthria.  PSYCHIATRIC: The patient is alert, oriented to time, person and place, cooperative. His memory is good. No significant confusion, agitation or depression noted.   LABORATORY DATA: BMP showed glucose 100; BUN and creatinine were 77 and 5.84, sodium 133; otherwise BMP was unremarkable. Estimated GFR for non-African American would be 10, as compared to 46 on  01/20/2012. Liver enzymes: Albumin level of 1.7, which is an improvement from 1.4 on 01/20/2012, alkaline phosphatase elevated to 236. Vancomycin level is 16. White blood cell count 8.4, hemoglobin 7.3,  platelet count 282. Absolute neutrophil count is elevated to 7.4. Urinalysis was taken, however, is not available yet.    RADIOLOGIC STUDIES: CT of abdomen and pelvis without contrast on 01/12/2012 showed no evidence of bowel obstruction, stable destructive changes in left hip as well as ulceration of right ischium. Right lower lobe pneumonia was also noted.   ASSESSMENT AND PLAN:  1.  Sepsis: Admit patient to medical floor. Start him on linezolid IV as well as ceftazidime to cover pseudomonas as well as methicillin-resistant Staphylococcus aureus infection. Will get infectious disease physician to come back and see patient again. Will be holding vancomycin due to concerns about possible acute renal failure related to vancomycin.  2.  Shock: Will continue IV fluids. We will likely need to transfuse him due to severe and resistant hypotension; we will need to transfuse packed red blood cells.  3.  Acute renal failure: We will hold the angiotensin-converting enzyme inhibitor. Will continue IV fluids for now and will get nephrologist evaluation. Will follow urine output. 4.  Bowel pseudoobstruction: Seemed to be relieved recently. Will resume bowel regimen. Will restart diabetic diet.  5.  Pneumonia, healthcare associated: Zyvox as well as ceftazidime IV for now. Pharmacy to dose. Sputum cultures will be taken according to respiratory therapist.  6.  History of methicillin resistant Staphylococcus aureus sepsis: Now on Zyvox. Vancomycin on hold for now. Will reconsult infectious disease.  7.  Anemia: We will plan for transfusion of 2 units of packed blood cells and reassess after transfusion. No obvious bleeding was noted; however, the patient had recent procedure done (debridement of his wound), which could have contributed to his anemia.   TIME SPENT: One hour 15 minutes.   ____________________________ Katharina Caper, MD rv:jm D: 01/27/2012 18:40:07 ET T: 01/27/2012 19:59:58  ET JOB#: [id number]  cc: Katharina Caper, MD, <Dictator> Serita Sheller. Maryellen Pile, MD Katharina Caper MD ELECTRONICALLY SIGNED 02/25/2012 17:12

## 2014-04-27 NOTE — Consult Note (Signed)
Brief Consult Note: Diagnosis: Mood disorder NOS.   Patient was seen by consultant.   Consult note dictated.   Recommend further assessment or treatment.   Orders entered.   Comments: Mr. Martin Jenkins has a h/o depression and mood instability. He is in care of unknown provider at his care home. He complains of mood swings, hyperactivity, easy crying. He worries that Zyprexa will cause weight gain. He complains of extremely poor slep.  PLAN: 1. We will start Tegretol for mood stabilization.   2. We may need to lower zyprexa. There appears to be no psychosis.   3. We offer Restoril for slep.  Electronic Signatures: Kristine LineaPucilowska, Frederik Standley (MD)  (Signed 13-Jan-14 20:49)  Authored: Brief Consult Note   Last Updated: 13-Jan-14 20:49 by Kristine LineaPucilowska, Emonii Wienke (MD)

## 2014-04-27 NOTE — Consult Note (Signed)
Brief Consult Note: Diagnosis: septic left hip, probable chronic osteomyelitis.   Patient was seen by consultant.   Consult note dictated.   Recommend to proceed with surgery or procedure.   Comments: recommend irrigation, debridement of left hip when meidccally stable. this would usually be an emergency procedure but with his paralysis it is less urgent.  Electronic Signatures: Leitha SchullerMenz, Carolan Avedisian J (MD)  (Signed 06-Jan-14 12:13)  Authored: Brief Consult Note   Last Updated: 06-Jan-14 12:13 by Leitha SchullerMenz, Rayvion Stumph J (MD)

## 2014-04-27 NOTE — Consult Note (Signed)
Impression: 54yo male w/ h/o paraplegia, DM, s/p right leg amputation, chronic osteomyelitis of the right pelvis, recent septic left hip with Methacillin Resistant Staph aureus admitted hypotension and influenza.  He has had some mild respiratory symptoms, but his influenza test was positive.  He received the flu shot during his last hospitalization.  It has not been long enough to have developed full immunity.    His creatinine had been improving upon discharge, but is back up on readmission.  ? related to influenza or dehydration or hypotension.  His BP is better with fluids.  Not requiring pressors. Agree with tamiflu x 5 days. Blood cultures are pending.  Would continue ceftazidime until they return.  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 Continue vanco for 6 week course per last hospitalization. Would continue contact isolation given his recent Methacillin Resistant Staph aureus infection.  Special droplet isolation for influenza as well. He missed his 1 week follow up in the office due to his admission.  Will check his CRP and ESR tomorrow.  Continue to follow creatinine.    Electronic Signatures: Traci Plemons, Heinz Knuckles (MD) (Signed on 23-Jan-14 21:47)  Authored   Last Updated: 23-Jan-14 21:57 by Kadiatou Oplinger, Heinz Knuckles (MD)

## 2015-01-24 IMAGING — US US RENAL KIDNEY
1 series · 14 of 25 positions shown · non-contrast
Comparison: none

REASON FOR EXAM: renal failure
COMMENTS:

[Series 1: us renal kidney · 0.34mm/px · 14 of 31 slices shown]
[im 1/31]
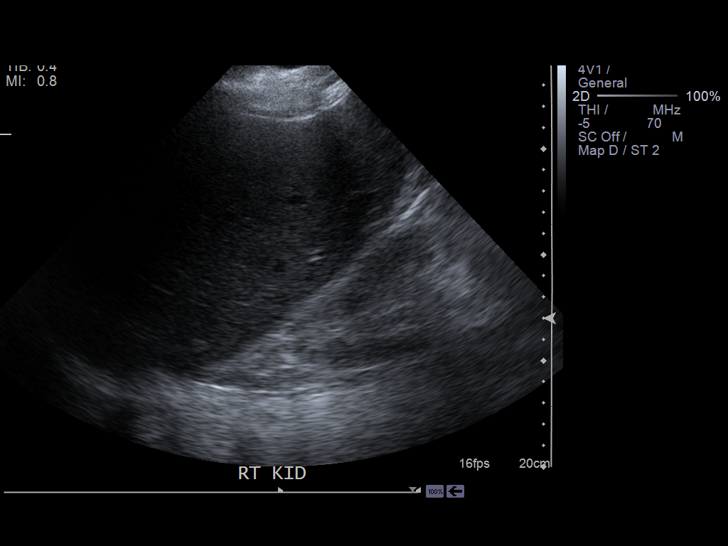
[im 3/31]
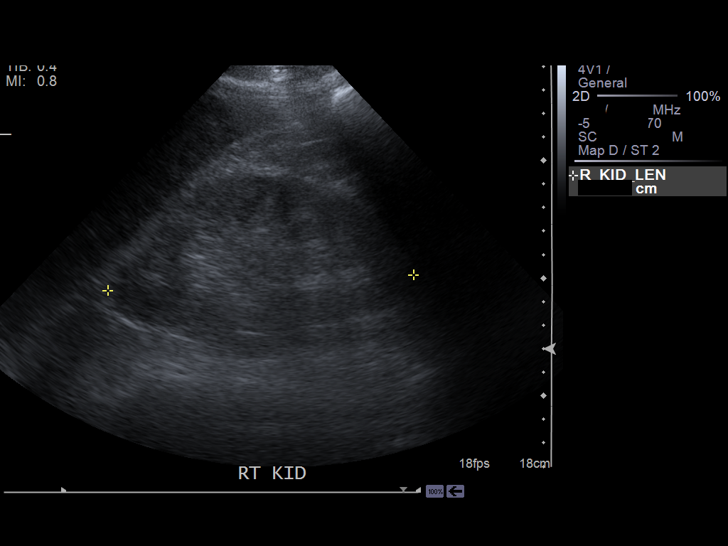
[im 6/31]
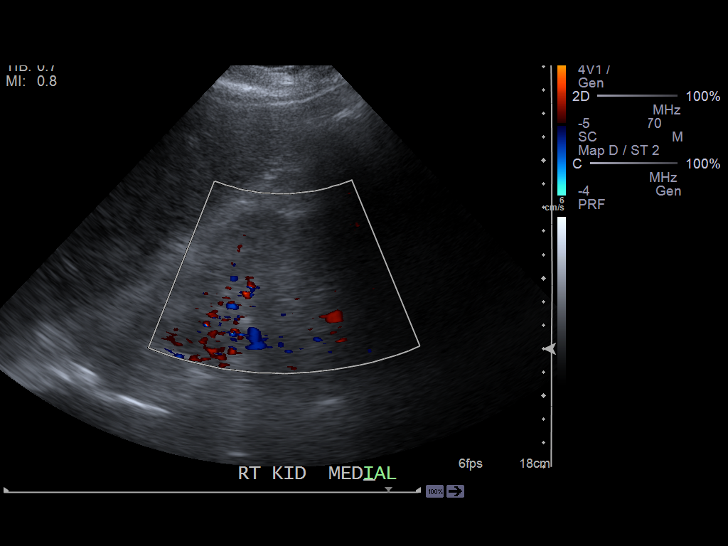
[im 8/31]
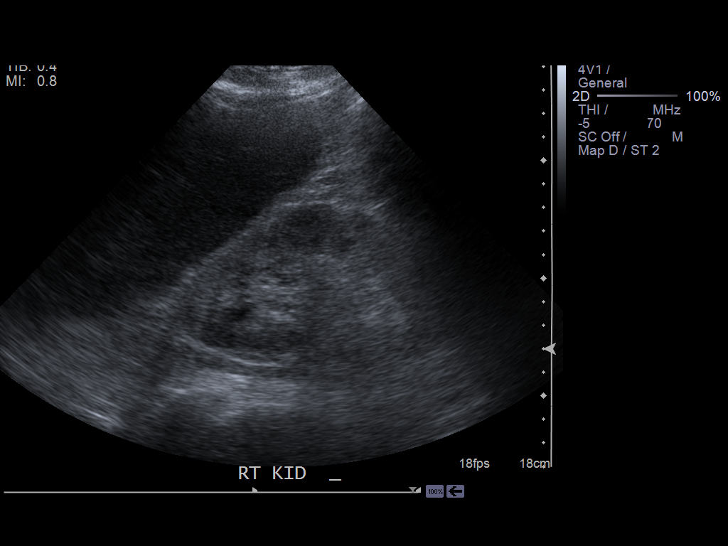
[im 11/31]
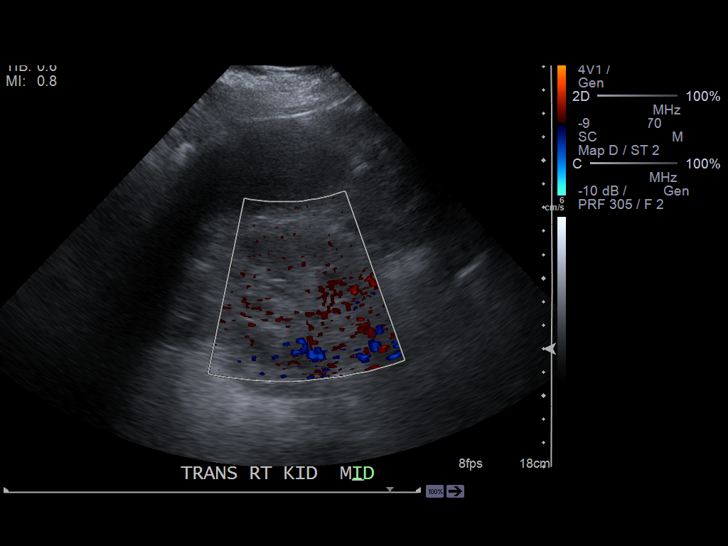
[im 12/31]
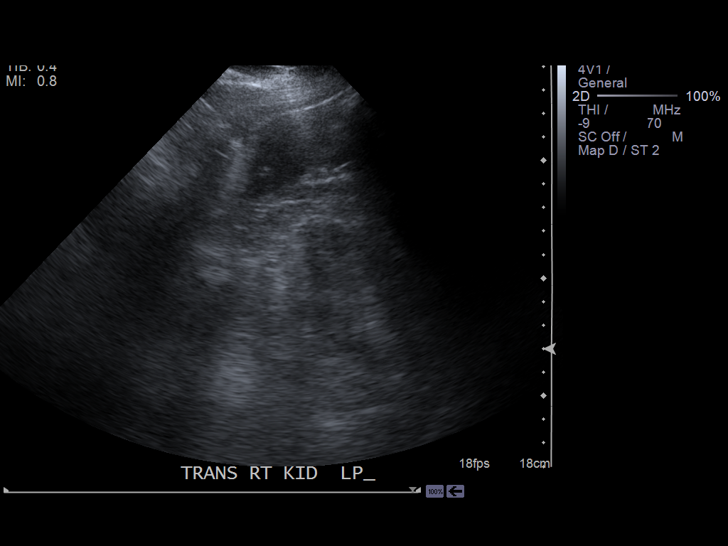
[im 14/31]
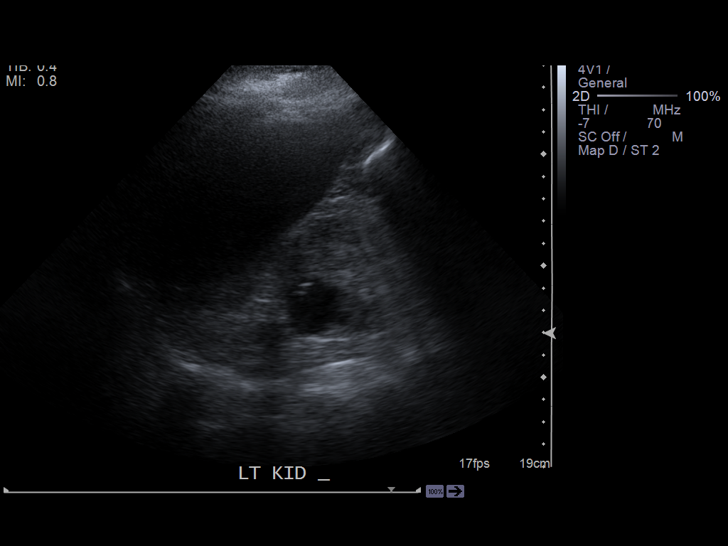
[im 17/31]
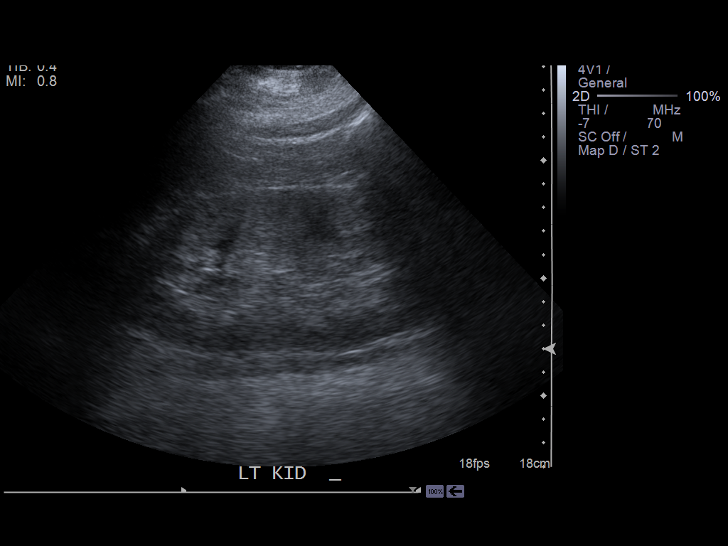
[im 19/31]
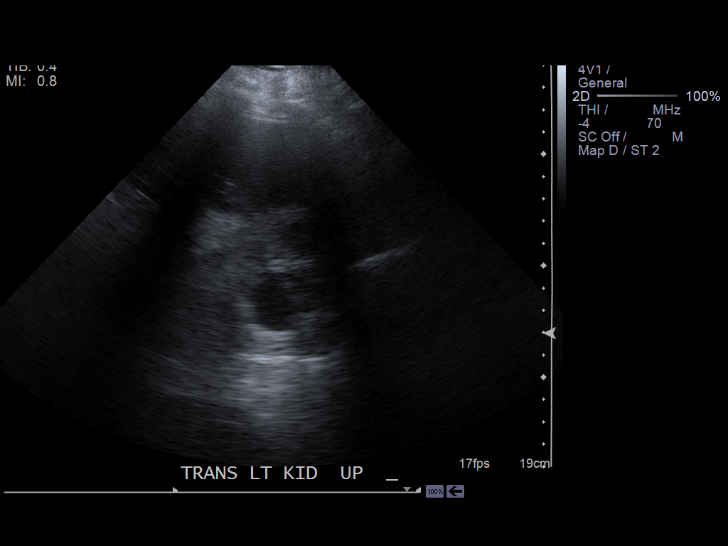
[im 21/31]
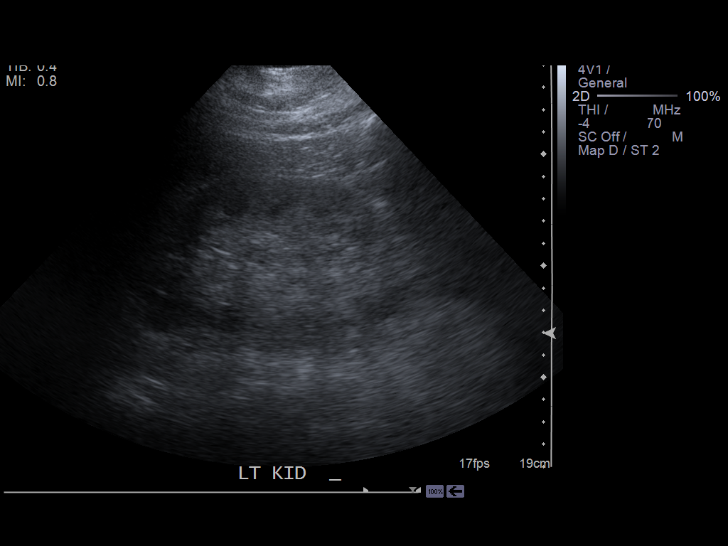
[im 23/31]
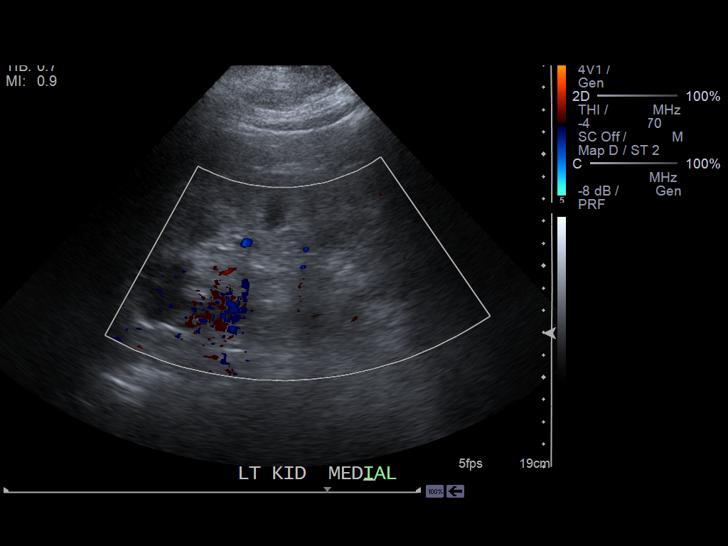
[im 26/31]
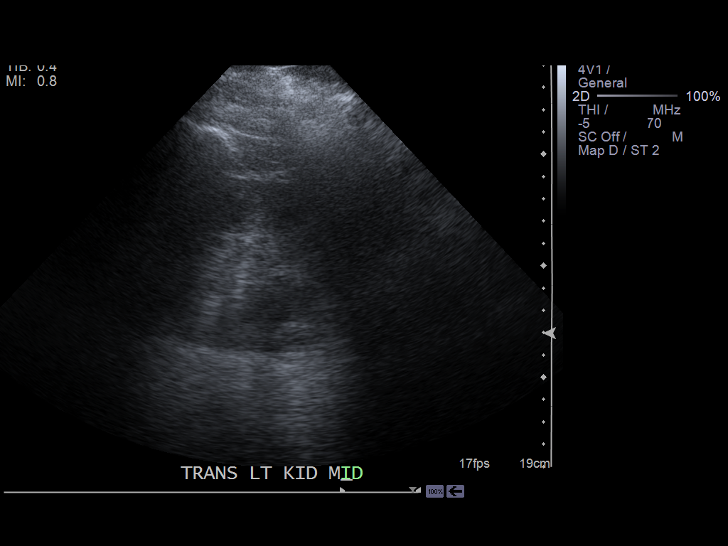
[im 28/31]
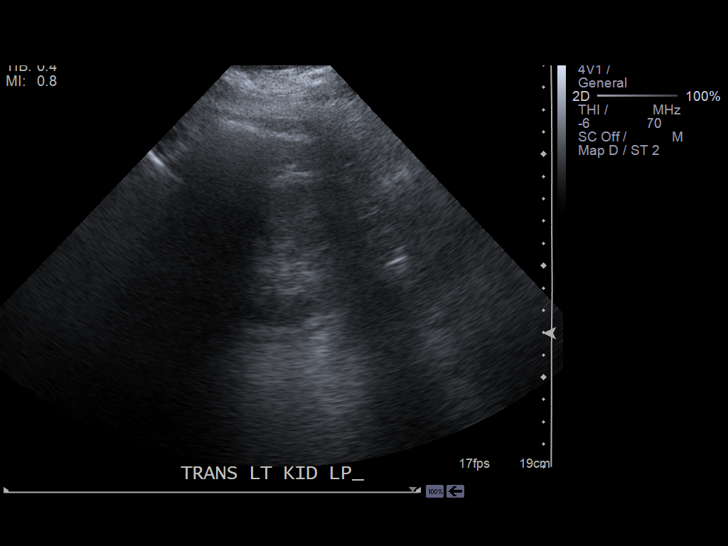
[im 31/31]
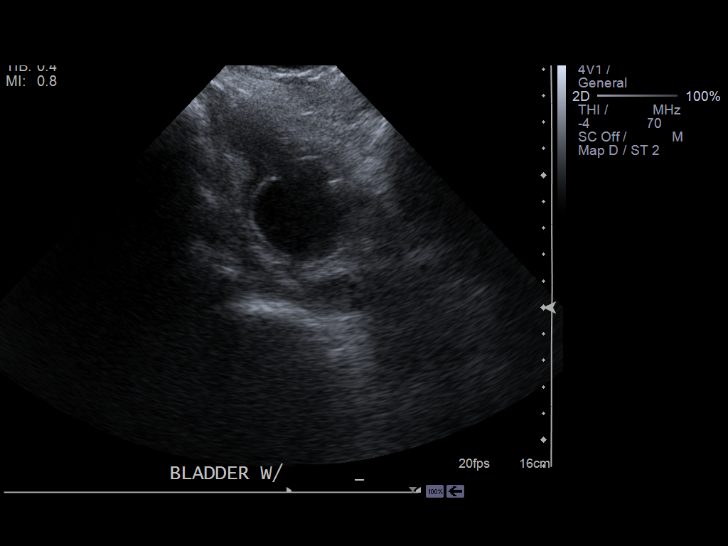

[14 of 25 positions shown; findings below may reference images not displayed]

PROCEDURE:     US  - US KIDNEY  - May 20, 2012  [DATE]

RESULT:     The right kidney measures 13 x 4.9 x 5.6 centimeters. The
echotexture of the renal cortex is increased diffusely as compared to the
liver. There is no hydronephrosis. The left kidney kidney demonstrates
similar increased cortical echotexture. It measures 13.4 x 5 x 6.5 cm. There
is a 2.8 cm diameter upper pole hypoechoic focus that likely reflects a
nonsimple cyst. This was demonstrated on CT scan 27 January, 2012.

The urinary bladder is decompressed with a Foley catheter.
IMPRESSION: 1. There is increased echotexture of the renal cortex of both kidneys. There
is no hydronephrosis.
2. In the upper pole of the left kidney there is a hypoechoic focus which
likely reflects a nonsimple cy[REDACTED]

## 2015-02-05 IMAGING — CT CT ABD-PELV W/O CM
1 of 2 series · 14 of 32 positions shown, 18 images · non-contrast
Comparison: none

REASON FOR EXAM: (1) left upper pole non simple renal cyst, please
evaluate.; (2) left upper pole
COMMENTS:

PROCEDURE:     CT  - CT ABDOMEN AND PELVIS W[DATE]  [DATE]
RESULT:     Comparison: CT of the abdomen and pelvis 01/27/2012, renal
ultrasound 05/20/2012
TECHNIQUE: Multiple axial images from the lung bases to the symphysis pubis
were obtained without oral and without intravenous contrast.

[Series 2: 3mm soft tissue · axial · 0.87mm/px · z∈[-1624,-1141]mm · 14 of 184 slices shown, 18 images]
[im 15/184  soft-tissue]
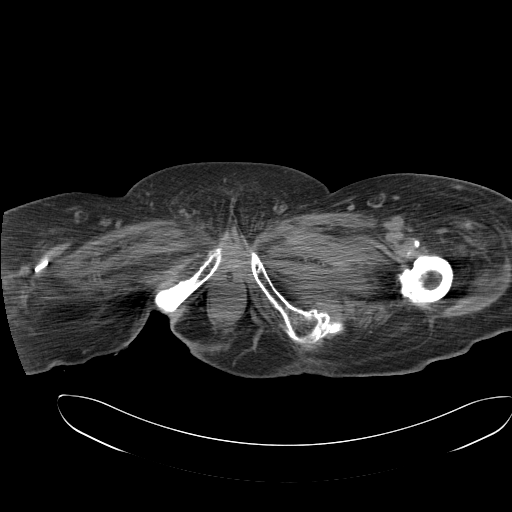
[im 15/184  bone]
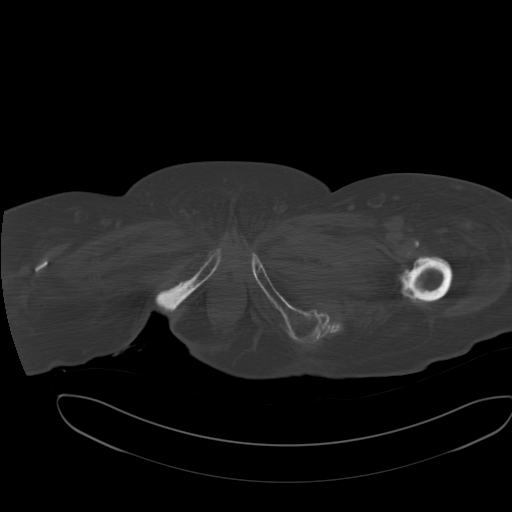
[im 29/184  soft-tissue]
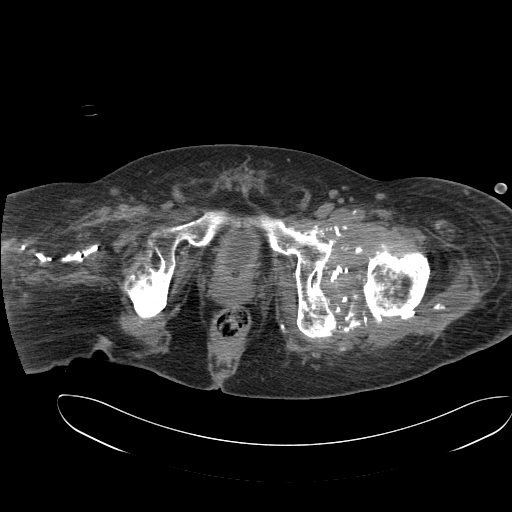
[im 43/184  soft-tissue]
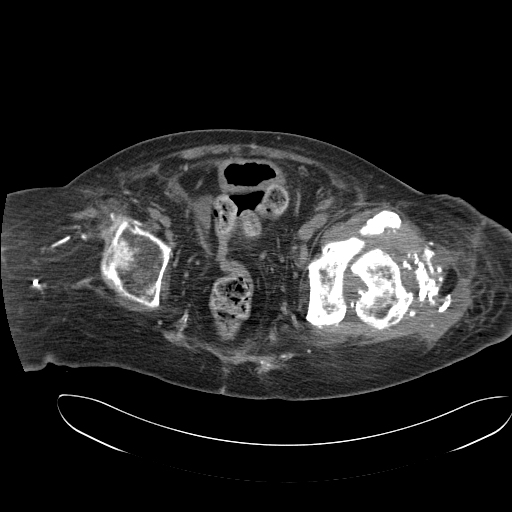
[im 57/184  soft-tissue]
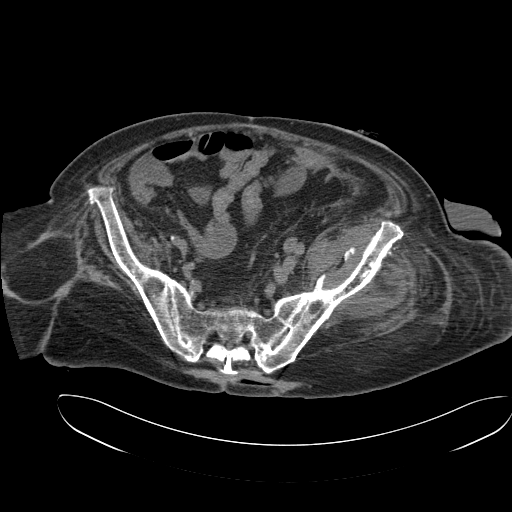
[im 71/184  soft-tissue]
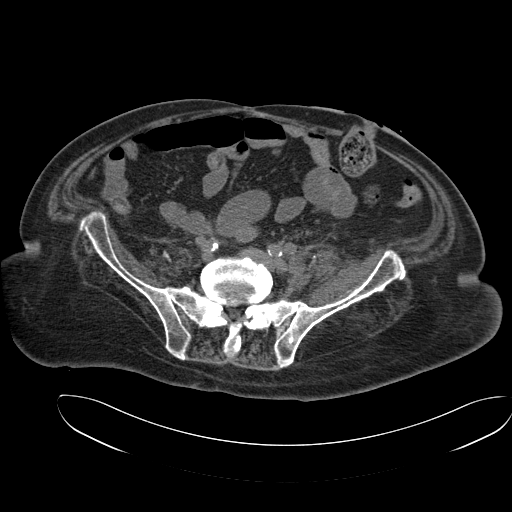
[im 85/184  soft-tissue]
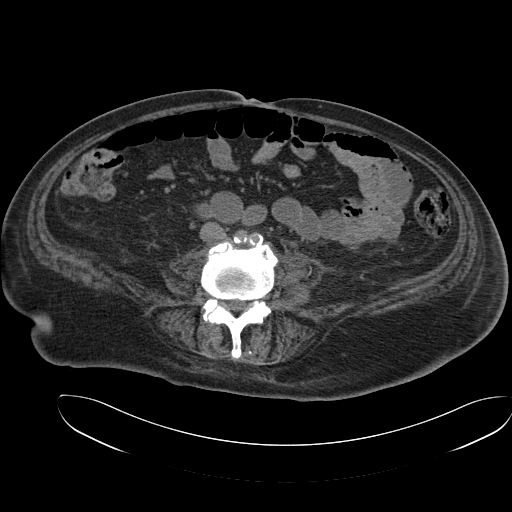
[im 99/184  soft-tissue]
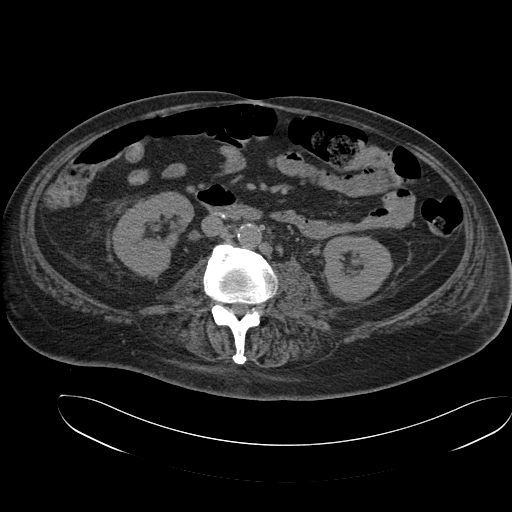
[im 113/184  soft-tissue]
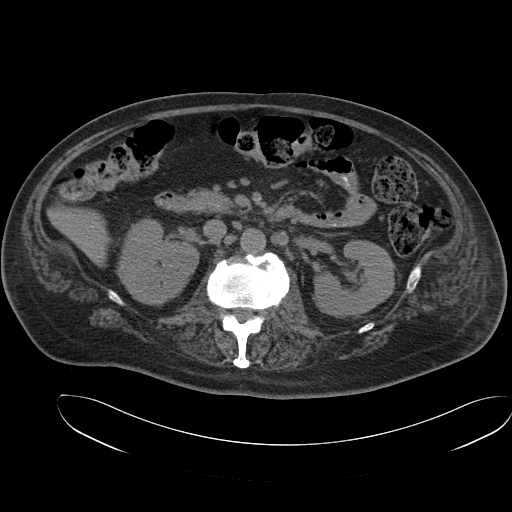
[im 127/184  soft-tissue]
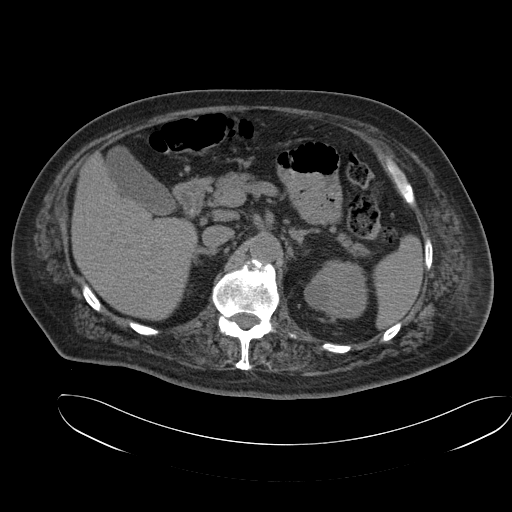
[im 127/184  bone]
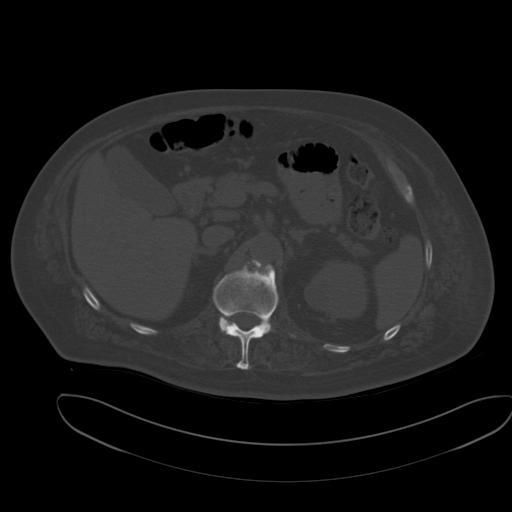
[im 141/184  soft-tissue]
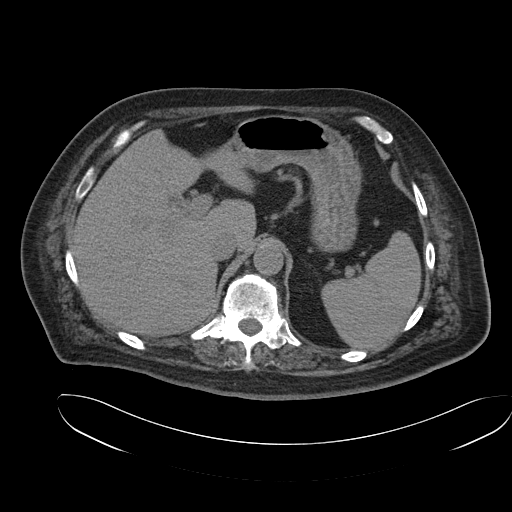
[im 155/184  soft-tissue]
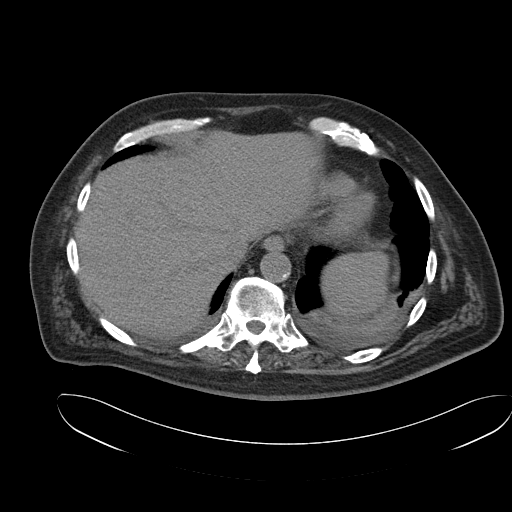
[im 155/184  lung]
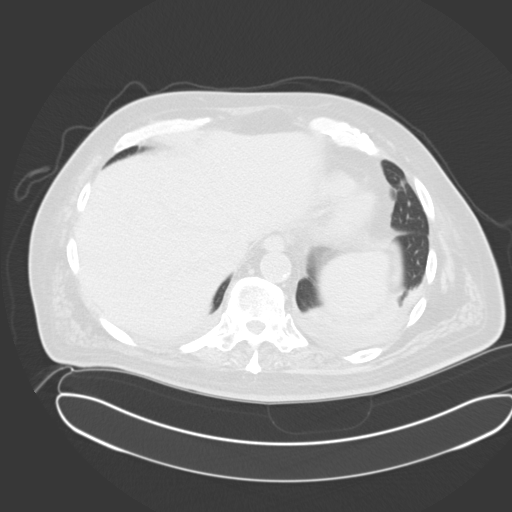
[im 162/184  lung]
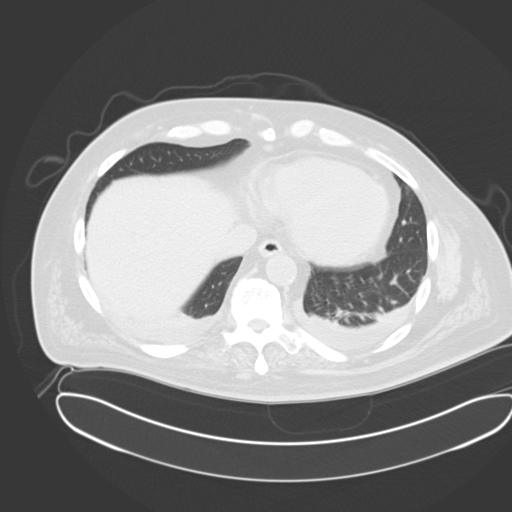
[im 169/184  soft-tissue]
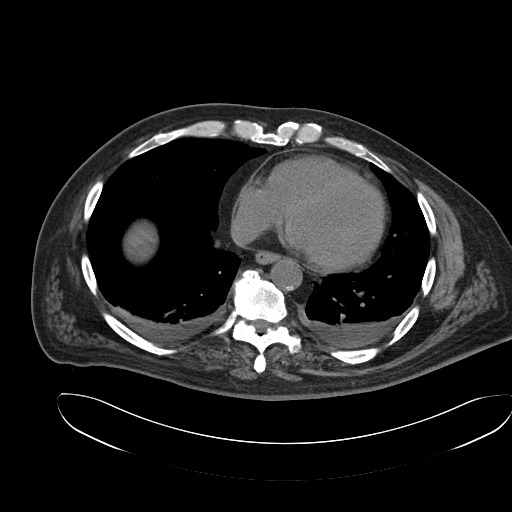
[im 169/184  lung]
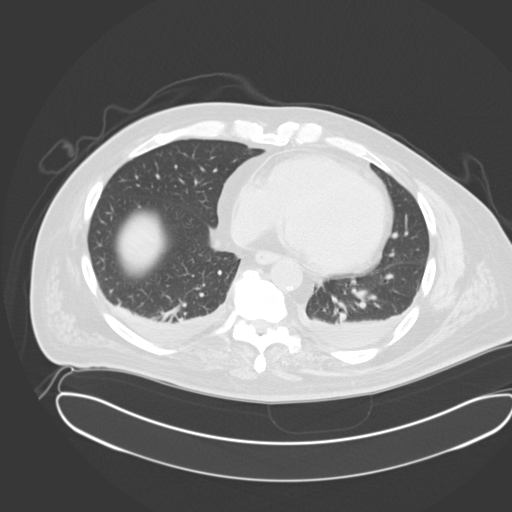
[im 176/184  lung]
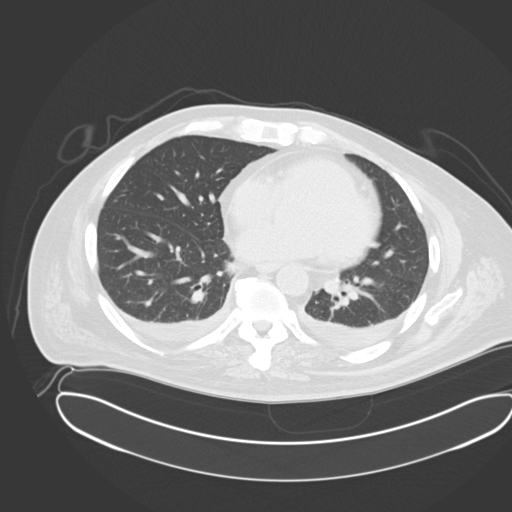

[14 of 32 positions shown; findings below may reference images not displayed]

FINDINGS: There are small bilateral pleural effusions. Mild basilar opacities are
likely secondary to atelectasis. Calcifications are seen in the coronary
arteries.

Lack of intravenous contrast limits evaluation of the solid abdominal
organs.  Grossly, the liver, the gallbladder, spleen, adrenals, and pancreas
are unremarkable. There is mild perinephric stranding, which is nonspecific.
There is a low-attenuation lesion in the superior pole of the left kidney.
This may represent a cyst, but is incompletely characterized given lack of
intravenous contrast. There is a thin peripheral calcification. It measures
2.4 cm in diameter, similar to prior CT. Other small low-attenuation focus
in left kidney is too to small to characterize. No renal calculi or
hydronephrosis. There is a soft tissue nodule in the anterior medial left
perinephric fat which could represent lymph node. It measures 1.8 cm in
diameter, slightly increased in size from prior. There are several other
mildly enlarged lymph nodes along the left side of the aorta in the
retroperitoneum extending inferiorly. These have increased in size from
prior.

There is an ostomy in the left lower quadrant. The small and large bowel are
normal in caliber. The appendix is normal. Air within the bladder is likely
due to the Foley catheterization. There is a small amount of free fluid in
pelvis, which is nonspecific.

There are marked destructive changes of the proximal left femur as well as
the inferior left iliac wing. There is marked associated soft tissue
thickening involving the left iliac wing as well as the residual proximal
left femur. Soft tissue thickening is seen extending to the skin laterally.
The changes could be secondary to chronic infection. Malignancy is not
excluded. There is sclerosis of the right ischium which could be secondary
to chronic infection as there is ulceration extending to the bone.
Metastatic disease is not excluded. The right femur is absent.
IMPRESSION: 1. Small low-attenuation mass in the left kidney may represent a cyst with
thin peripheral calcification, similar to prior CT, but is incompletely
characterized given lack of intravenous contrast. Continued followup is
suggested.
2. Marked destructive changes and soft tissue thickening involving the
proximal left femur and left iliac wing. There is soft tissue thickening
extending to the skin laterally. The changes may be secondary to chronic
infection. Malignancy is not excluded. Clinical correlation is recommended.
3. There are multiple mildly enlarged left retroperitoneal lymph nodes which
are slightly increased in size from prior. These are nonspecific, but may
related to the changes involving the left hip.
4. Sclerosis of the right ischium may be related to chronic osteomyelitis
given the adjacent ulceration, and is unchanged from prior. Metastatic
disease would be of differential consideration.
